# Patient Record
Sex: Female | Born: 1985 | Race: White | Hispanic: No | Marital: Married | Smoking: Never smoker
Health system: Southern US, Community
[De-identification: ages and names within clinical notes are randomized; demographics above are authoritative.]

## PROBLEM LIST (undated history)

## (undated) DIAGNOSIS — Z8719 Personal history of other diseases of the digestive system: Secondary | ICD-10-CM

## (undated) DIAGNOSIS — N809 Endometriosis, unspecified: Secondary | ICD-10-CM

## (undated) DIAGNOSIS — N200 Calculus of kidney: Secondary | ICD-10-CM

## (undated) DIAGNOSIS — Z87898 Personal history of other specified conditions: Secondary | ICD-10-CM

## (undated) DIAGNOSIS — N3010 Interstitial cystitis (chronic) without hematuria: Secondary | ICD-10-CM

## (undated) DIAGNOSIS — F419 Anxiety disorder, unspecified: Secondary | ICD-10-CM

## (undated) DIAGNOSIS — Z8742 Personal history of other diseases of the female genital tract: Secondary | ICD-10-CM

## (undated) DIAGNOSIS — M419 Scoliosis, unspecified: Secondary | ICD-10-CM

## (undated) DIAGNOSIS — R51 Headache: Secondary | ICD-10-CM

## (undated) DIAGNOSIS — K5909 Other constipation: Secondary | ICD-10-CM

## (undated) DIAGNOSIS — K589 Irritable bowel syndrome without diarrhea: Secondary | ICD-10-CM

## (undated) DIAGNOSIS — Z8632 Personal history of gestational diabetes: Secondary | ICD-10-CM

## (undated) DIAGNOSIS — N390 Urinary tract infection, site not specified: Secondary | ICD-10-CM

## (undated) DIAGNOSIS — I7300 Raynaud's syndrome without gangrene: Secondary | ICD-10-CM

## (undated) DIAGNOSIS — R519 Headache, unspecified: Secondary | ICD-10-CM

## (undated) HISTORY — DX: Headache, unspecified: R51.9

## (undated) HISTORY — DX: Anxiety disorder, unspecified: F41.9

## (undated) HISTORY — DX: Personal history of other diseases of the female genital tract: Z87.42

## (undated) HISTORY — DX: Scoliosis, unspecified: M41.9

## (undated) HISTORY — DX: Calculus of kidney: N20.0

## (undated) HISTORY — PX: WISDOM TOOTH EXTRACTION: SHX21

## (undated) HISTORY — DX: Headache: R51

## (undated) HISTORY — DX: Endometriosis, unspecified: N80.9

## (undated) HISTORY — DX: Personal history of other diseases of the digestive system: Z87.19

## (undated) HISTORY — DX: Raynaud's syndrome without gangrene: I73.00

## (undated) HISTORY — DX: Urinary tract infection, site not specified: N39.0

## (undated) HISTORY — DX: Irritable bowel syndrome, unspecified: K58.9

## (undated) HISTORY — PX: EYE SURGERY: SHX253

## (undated) HISTORY — DX: Interstitial cystitis (chronic) without hematuria: N30.10

## (undated) HISTORY — DX: Other constipation: K59.09

## (undated) HISTORY — DX: Personal history of gestational diabetes: Z86.32

## (undated) HISTORY — DX: Personal history of other specified conditions: Z87.898

---
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
## 2003-05-08 ENCOUNTER — Other Ambulatory Visit: Admission: RE | Admit: 2003-05-08 | Discharge: 2003-05-08 | Payer: Self-pay | Admitting: Obstetrics and Gynecology

## 2004-08-27 ENCOUNTER — Other Ambulatory Visit: Admission: RE | Admit: 2004-08-27 | Discharge: 2004-08-27 | Payer: Self-pay | Admitting: Obstetrics and Gynecology

## 2008-09-18 ENCOUNTER — Other Ambulatory Visit: Admission: RE | Admit: 2008-09-18 | Discharge: 2008-09-18 | Payer: Self-pay | Admitting: Family Medicine

## 2009-01-10 ENCOUNTER — Ambulatory Visit (HOSPITAL_COMMUNITY): Admission: RE | Admit: 2009-01-10 | Discharge: 2009-01-10 | Payer: Self-pay | Admitting: Obstetrics and Gynecology

## 2009-11-10 ENCOUNTER — Inpatient Hospital Stay (HOSPITAL_COMMUNITY): Admission: AD | Admit: 2009-11-10 | Discharge: 2009-11-10 | Payer: Self-pay | Admitting: Obstetrics and Gynecology

## 2009-11-11 ENCOUNTER — Inpatient Hospital Stay (HOSPITAL_COMMUNITY): Admission: AD | Admit: 2009-11-11 | Discharge: 2009-11-13 | Payer: Self-pay | Admitting: Obstetrics & Gynecology

## 2010-08-24 LAB — CBC
HCT: 22.2 % — ABNORMAL LOW (ref 36.0–46.0)
HCT: 30.9 % — ABNORMAL LOW (ref 36.0–46.0)
Hemoglobin: 7.3 g/dL — ABNORMAL LOW (ref 12.0–15.0)
Hemoglobin: 8 g/dL — ABNORMAL LOW (ref 12.0–15.0)
MCHC: 36 g/dL (ref 30.0–36.0)
MCV: 89.5 fL (ref 78.0–100.0)
MCV: 89.5 fL (ref 78.0–100.0)
RBC: 2.28 MIL/uL — ABNORMAL LOW (ref 3.87–5.11)
RBC: 2.45 MIL/uL — ABNORMAL LOW (ref 3.87–5.11)
RDW: 13.3 % (ref 11.5–15.5)
WBC: 15.3 10*3/uL — ABNORMAL HIGH (ref 4.0–10.5)

## 2010-08-24 LAB — COMPREHENSIVE METABOLIC PANEL
AST: 27 U/L (ref 0–37)
Alkaline Phosphatase: 76 U/L (ref 39–117)
Calcium: 8.4 mg/dL (ref 8.4–10.5)

## 2010-08-24 LAB — RH IMMUNE GLOB WKUP(>/=20WKS)(NOT WOMEN'S HOSP): Fetal Screen: NEGATIVE

## 2010-08-24 LAB — LACTATE DEHYDROGENASE: LDH: 214 U/L (ref 94–250)

## 2010-08-24 LAB — RPR: RPR Ser Ql: NONREACTIVE

## 2010-09-13 LAB — ABO/RH: ABO/RH(D): O NEG

## 2011-03-08 ENCOUNTER — Ambulatory Visit (HOSPITAL_BASED_OUTPATIENT_CLINIC_OR_DEPARTMENT_OTHER)
Admission: RE | Admit: 2011-03-08 | Discharge: 2011-03-08 | Disposition: A | Discharge status: Home / Self Care | Payer: 59 | Source: Ambulatory Visit | Attending: Family Medicine | Admitting: Family Medicine

## 2011-03-08 ENCOUNTER — Other Ambulatory Visit (HOSPITAL_BASED_OUTPATIENT_CLINIC_OR_DEPARTMENT_OTHER): Payer: Self-pay | Admitting: Family Medicine

## 2011-03-08 DIAGNOSIS — R109 Unspecified abdominal pain: Secondary | ICD-10-CM

## 2011-03-08 DIAGNOSIS — R1031 Right lower quadrant pain: Secondary | ICD-10-CM

## 2011-03-08 DIAGNOSIS — N201 Calculus of ureter: Secondary | ICD-10-CM | POA: Insufficient documentation

## 2011-03-08 DIAGNOSIS — R319 Hematuria, unspecified: Secondary | ICD-10-CM | POA: Insufficient documentation

## 2011-03-08 DIAGNOSIS — N200 Calculus of kidney: Secondary | ICD-10-CM

## 2012-01-20 ENCOUNTER — Emergency Department (HOSPITAL_COMMUNITY): Payer: 59

## 2012-01-20 ENCOUNTER — Emergency Department (HOSPITAL_COMMUNITY)
Admission: EM | Admit: 2012-01-20 | Discharge: 2012-01-20 | Disposition: A | Discharge status: Home / Self Care | Payer: 59 | Attending: Emergency Medicine | Admitting: Emergency Medicine

## 2012-01-20 ENCOUNTER — Encounter (HOSPITAL_COMMUNITY): Payer: Self-pay | Admitting: Emergency Medicine

## 2012-01-20 DIAGNOSIS — N83209 Unspecified ovarian cyst, unspecified side: Secondary | ICD-10-CM

## 2012-01-20 DIAGNOSIS — R10819 Abdominal tenderness, unspecified site: Secondary | ICD-10-CM | POA: Insufficient documentation

## 2012-01-20 DIAGNOSIS — R109 Unspecified abdominal pain: Secondary | ICD-10-CM | POA: Insufficient documentation

## 2012-01-20 DIAGNOSIS — R112 Nausea with vomiting, unspecified: Secondary | ICD-10-CM | POA: Insufficient documentation

## 2012-01-20 LAB — CBC
HCT: 39.1 % (ref 36.0–46.0)
Hemoglobin: 13.7 g/dL (ref 12.0–15.0)
MCH: 31.1 pg (ref 26.0–34.0)
MCHC: 35 g/dL (ref 30.0–36.0)
MCV: 88.9 fL (ref 78.0–100.0)
Platelets: 199 10*3/uL (ref 150–400)
RBC: 4.4 MIL/uL (ref 3.87–5.11)
RDW: 12.6 % (ref 11.5–15.5)
WBC: 10.1 10*3/uL (ref 4.0–10.5)

## 2012-01-20 LAB — URINE MICROSCOPIC-ADD ON

## 2012-01-20 LAB — URINALYSIS, ROUTINE W REFLEX MICROSCOPIC
Bilirubin Urine: NEGATIVE
Glucose, UA: NEGATIVE mg/dL
Nitrite: NEGATIVE
Protein, ur: NEGATIVE mg/dL
Specific Gravity, Urine: 1.013 (ref 1.005–1.030)

## 2012-01-20 LAB — POCT I-STAT, CHEM 8
Creatinine, Ser: 0.9 mg/dL (ref 0.50–1.10)
Glucose, Bld: 96 mg/dL (ref 70–99)
Potassium: 4.3 mEq/L (ref 3.5–5.1)

## 2012-01-20 LAB — HEPATIC FUNCTION PANEL
ALT: 39 U/L — ABNORMAL HIGH (ref 0–35)
AST: 34 U/L (ref 0–37)
Albumin: 3.9 g/dL (ref 3.5–5.2)
Bilirubin, Direct: <0.1 mg/dL (ref 0.0–0.3)
Total Protein: 7.2 g/dL (ref 6.0–8.3)

## 2012-01-20 LAB — WET PREP, GENITAL: Trich, Wet Prep: NONE SEEN

## 2012-01-20 MED ORDER — ONDANSETRON HCL 4 MG PO TABS: 4.0000 mg | ORAL_TABLET | Freq: Four times a day (QID) | ORAL | Status: AC

## 2012-01-20 MED ORDER — ONDANSETRON 4 MG PO TBDP
8.0000 mg | ORAL_TABLET | Freq: Once | ORAL | Status: AC
  Administered 2012-01-20: 8 mg via ORAL
  Filled 2012-01-20: qty 2

## 2012-01-20 MED ORDER — HYDROCODONE-ACETAMINOPHEN 5-500 MG PO TABS: 1.0000 | ORAL_TABLET | Freq: Four times a day (QID) | ORAL | Status: AC | PRN

## 2012-01-20 NOTE — ED Notes (Signed)
Patient transported to Ultrasound 

## 2012-01-20 NOTE — ED Notes (Signed)
Pt  Here with c/o lower abd pain . Pain started this morning no bleeding or discharge

## 2012-01-20 NOTE — ED Notes (Signed)
Pt is 3 weeks late on her period

## 2012-01-20 NOTE — ED Provider Notes (Signed)
History     CSN: [id number]  Arrival date & time 01/20/12  0930   First MD Initiated Contact with Patient 01/20/12 1006      Chief Complaint  Patient presents with  . Abdominal Pain    (Consider location/radiation/quality/duration/timing/severity/associated sxs/prior treatment) Patient is a 26 y.o. female presenting with abdominal pain. The history is provided by the patient.  Abdominal Pain The primary symptoms of the illness include abdominal pain, nausea and vomiting. The primary symptoms of the illness do not include fever, dysuria, vaginal discharge or vaginal bleeding. Primary symptoms comment: one loose stool this am The current episode started 1 to 2 hours ago. The onset of the illness was sudden. The problem has not changed since onset. The pain came on suddenly. The abdominal pain has been unchanged since its onset. Pain Location: right pelvic and suprapubic region. The abdominal pain radiates to the right flank. The severity of the abdominal pain is 10/10. The abdominal pain is relieved by nothing. The abdominal pain is exacerbated by movement.  The vomiting began today. Vomiting occurs 2 to 5 times per day. The emesis contains stomach contents (dry heaving).  Additional symptoms associated with the illness include anorexia. Symptoms associated with the illness do not include constipation, urgency, frequency or back pain.    History reviewed. No pertinent past medical history.  History reviewed. No pertinent past surgical history.  History reviewed. No pertinent family history.  History  Substance Use Topics  . Smoking status: Never Smoker   . Smokeless tobacco: Not on file  . Alcohol Use: No    OB History    Grav Para Term Preterm Abortions TAB SAB Ect Mult Living                  Review of Systems  Constitutional: Negative for fever.  Gastrointestinal: Positive for nausea, vomiting, abdominal pain and anorexia. Negative for constipation.  Genitourinary:  Negative for dysuria, urgency, frequency, vaginal bleeding and vaginal discharge.  Musculoskeletal: Negative for back pain.  All other systems reviewed and are negative.    Allergies  Review of patient's allergies indicates no known allergies.  Home Medications  No current outpatient prescriptions on file.  BP 101/68  Pulse 70  Temp 97.6 F (36.4 C) (Oral)  Resp 18  SpO2 100%  Physical Exam  Nursing note and vitals reviewed. Constitutional: She is oriented to person, place, and time. She appears well-developed and well-nourished. No distress.  HENT:  Head: Normocephalic and atraumatic.  Mouth/Throat: Oropharynx is clear and moist.  Eyes: Conjunctivae and EOM are normal. Pupils are equal, round, and reactive to light.  Neck: Normal range of motion. Neck supple.  Cardiovascular: Normal rate, regular rhythm and intact distal pulses.   No murmur heard. Pulmonary/Chest: Effort normal and breath sounds normal. No respiratory distress. She has no wheezes. She has no rales.  Abdominal: Soft. Normal appearance. She exhibits no distension. There is tenderness in the suprapubic area. There is no rebound and no guarding.    Genitourinary: Vagina normal and uterus normal. Cervix exhibits no discharge. Right adnexum displays tenderness. Right adnexum displays no mass and no fullness. Left adnexum displays no mass, no tenderness and no fullness.  Musculoskeletal: Normal range of motion. She exhibits no edema and no tenderness.  Neurological: She is alert and oriented to person, place, and time.  Skin: Skin is warm and dry. No rash noted. No erythema.  Psychiatric: She has a normal mood and affect. Her behavior is normal.  ED Course  Procedures (including critical care time)  Labs Reviewed  URINALYSIS, ROUTINE W REFLEX MICROSCOPIC - Abnormal; Notable for the following:    Leukocytes, UA TRACE (*)     All other components within normal limits  HEPATIC FUNCTION PANEL - Abnormal;  Notable for the following:    ALT 39 (*)     All other components within normal limits  WET PREP, GENITAL - Abnormal; Notable for the following:    Clue Cells Wet Prep HPF POC FEW (*)     WBC, Wet Prep HPF POC MODERATE (*)     All other components within normal limits  CBC  POCT I-STAT, CHEM 8  URINE MICROSCOPIC-ADD ON  POCT PREGNANCY, URINE  GC/CHLAMYDIA PROBE AMP, GENITAL   US Transvaginal Non-ob  01/20/2012  *RADIOLOGY REPORT*  Clinical Data:  Evaluate for right ovarian cyst versus torsion.  TRANSABDOMINAL AND TRANSVAGINAL ULTRASOUND OF PELVIS DOPPLER ULTRASOUND OF OVARIES  Technique:  Both transabdominal and transvaginal ultrasound examinations of the pelvis were performed. Transabdominal technique was performed for global imaging of the pelvis including uterus, ovaries, adnexal regions, and pelvic cul-de-sac.  It was necessary to proceed with endovaginal exam following the transabdominal exam to visualize the ovaries.  Color and duplex Doppler ultrasound was utilized to evaluate blood flow to the ovaries.  Comparison:  CT abdomen pelvis 03/08/2011  Findings:  Uterus:  Normal in size and appearance.  Endometrium:  Measures 7 mm maximal thickness.  Right ovary: Measures 4.0 x 2.9 x 3.8 cm and contains a dominant follicle versus corpus luteum that measures 2.5 cm maximal diameter.  Color Doppler flow is identified to the right ovary.  Venous and arterial waveforms are documented within the right ovarian parenchyma on Doppler imaging.  Left ovary:   The left ovary is positioned in the cul-de-sac and contains a complex cyst measuring 5.8 x 3.8 x 4.4 cm.  There are a few thin internal septations as well as very lacy septations, likely reflecting retracting clot within a hemorrhagic cyst. Color Doppler flow is identified to the surrounding left ovarian parenchyma.  No flow is seen within the complex cyst.  Doppler imaging demonstrates venous and arterial waveforms within the ovarian parenchyma.  There is  a moderate amount of free fluid in the pelvis, predominately seen adjacent to the left ovary, within the cul-de- sac and left adnexa.  IMPRESSION: 1.  5.8 x 3.8 x 4.4 cm mildly complex cyst in the left ovary has appearances most suggestive of a hemorrhagic cyst.  Moderate amount of free fluid in the pelvis. The possibility of cyst rupture is considered.  Short-interval follow up ultrasound in 6-12 weeks is recommended, preferably during the week following the patient's normal menses. 2.  No sonographic evidence of ovarian torsion. 3.  Uterus and right ovary within normal limits.  Original Report Authenticated By: Britta Mccreedy, M.D.   US Pelvis Complete  01/20/2012  *RADIOLOGY REPORT*  Clinical Data:  Evaluate for right ovarian cyst versus torsion.  TRANSABDOMINAL AND TRANSVAGINAL ULTRASOUND OF PELVIS DOPPLER ULTRASOUND OF OVARIES  Technique:  Both transabdominal and transvaginal ultrasound examinations of the pelvis were performed. Transabdominal technique was performed for global imaging of the pelvis including uterus, ovaries, adnexal regions, and pelvic cul-de-sac.  It was necessary to proceed with endovaginal exam following the transabdominal exam to visualize the ovaries.  Color and duplex Doppler ultrasound was utilized to evaluate blood flow to the ovaries.  Comparison:  CT abdomen pelvis 03/08/2011  Findings:  Uterus:  Normal in  size and appearance.  Endometrium:  Measures 7 mm maximal thickness.  Right ovary: Measures 4.0 x 2.9 x 3.8 cm and contains a dominant follicle versus corpus luteum that measures 2.5 cm maximal diameter.  Color Doppler flow is identified to the right ovary.  Venous and arterial waveforms are documented within the right ovarian parenchyma on Doppler imaging.  Left ovary:   The left ovary is positioned in the cul-de-sac and contains a complex cyst measuring 5.8 x 3.8 x 4.4 cm.  There are a few thin internal septations as well as very lacy septations, likely reflecting retracting  clot within a hemorrhagic cyst. Color Doppler flow is identified to the surrounding left ovarian parenchyma.  No flow is seen within the complex cyst.  Doppler imaging demonstrates venous and arterial waveforms within the ovarian parenchyma.  There is a moderate amount of free fluid in the pelvis, predominately seen adjacent to the left ovary, within the cul-de- sac and left adnexa.  IMPRESSION: 1.  5.8 x 3.8 x 4.4 cm mildly complex cyst in the left ovary has appearances most suggestive of a hemorrhagic cyst.  Moderate amount of free fluid in the pelvis. The possibility of cyst rupture is considered.  Short-interval follow up ultrasound in 6-12 weeks is recommended, preferably during the week following the patient's normal menses. 2.  No sonographic evidence of ovarian torsion. 3.  Uterus and right ovary within normal limits.  Original Report Authenticated By: Britta Mccreedy, M.D.   [place] Art/ven Flow Abd Pelv Doppler  01/20/2012  *RADIOLOGY REPORT*  Clinical Data:  Evaluate for right ovarian cyst versus torsion.  TRANSABDOMINAL AND TRANSVAGINAL ULTRASOUND OF PELVIS DOPPLER ULTRASOUND OF OVARIES  Technique:  Both transabdominal and transvaginal ultrasound examinations of the pelvis were performed. Transabdominal technique was performed for global imaging of the pelvis including uterus, ovaries, adnexal regions, and pelvic cul-de-sac.  It was necessary to proceed with endovaginal exam following the transabdominal exam to visualize the ovaries.  Color and duplex Doppler ultrasound was utilized to evaluate blood flow to the ovaries.  Comparison:  CT abdomen pelvis 03/08/2011  Findings:  Uterus:  Normal in size and appearance.  Endometrium:  Measures 7 mm maximal thickness.  Right ovary: Measures 4.0 x 2.9 x 3.8 cm and contains a dominant follicle versus corpus luteum that measures 2.5 cm maximal diameter.  Color Doppler flow is identified to the right ovary.  Venous and arterial waveforms are documented within the right  ovarian parenchyma on Doppler imaging.  Left ovary:   The left ovary is positioned in the cul-de-sac and contains a complex cyst measuring 5.8 x 3.8 x 4.4 cm.  There are a few thin internal septations as well as very lacy septations, likely reflecting retracting clot within a hemorrhagic cyst. Color Doppler flow is identified to the surrounding left ovarian parenchyma.  No flow is seen within the complex cyst.  Doppler imaging demonstrates venous and arterial waveforms within the ovarian parenchyma.  There is a moderate amount of free fluid in the pelvis, predominately seen adjacent to the left ovary, within the cul-de- sac and left adnexa.  IMPRESSION: 1.  5.8 x 3.8 x 4.4 cm mildly complex cyst in the left ovary has appearances most suggestive of a hemorrhagic cyst.  Moderate amount of free fluid in the pelvis. The possibility of cyst rupture is considered.  Short-interval follow up ultrasound in 6-12 weeks is recommended, preferably during the week following the patient's normal menses. 2.  No sonographic evidence of ovarian torsion. 3.  Uterus and right ovary within normal limits.  Original Report Authenticated By: Britta Mccreedy, M.D.     1. Ovarian cyst       MDM   Patient with abrupt onset of a lower right-sided abdominal pain today that began 2 hours ago. The pain was so severe that it caused her to dry heave. She states in the past she's had ovarian cysts and a possible muscle strain in the right lower quadrant but states it's never been this bad. She does not take medications regularly and has no allergies. On exam she is exquisitely tender in the right pelvic region. No symptoms suggestive of appendicitis at this time.  Also states she is 3 weeks late on her period however her husband has had a vasectomy.  Also possibility for kidney stone however symptoms are not classic. Lower concern for ectopic due to husband having a vasectomy.  CBC, i-STAT, LFTs, UA, UPT, ultrasound pending.  1:30  PM Labs unrevealing except for ultrasound showing free pelvic fluid consistent with a ruptured ovarian cyst as well as a complex cyst on the left ovary. Patient will followup with her OB/GYN for repeat ultrasound and followup      Gwyneth Sprout, MD 01/20/12 1330

## 2012-01-20 NOTE — ED Notes (Signed)
Patient in ultrasound.

## 2012-02-22 ENCOUNTER — Ambulatory Visit (HOSPITAL_BASED_OUTPATIENT_CLINIC_OR_DEPARTMENT_OTHER): Admission: RE | Admit: 2012-02-22 | Payer: 59 | Source: Ambulatory Visit | Admitting: Obstetrics and Gynecology

## 2012-02-22 ENCOUNTER — Encounter (HOSPITAL_BASED_OUTPATIENT_CLINIC_OR_DEPARTMENT_OTHER): Admission: RE | Payer: Self-pay | Source: Ambulatory Visit

## 2012-02-22 SURGERY — LAPAROSCOPY, DIAGNOSTIC
Anesthesia: General

## 2012-04-20 HISTORY — PX: DOPPLER ECHOCARDIOGRAPHY: SHX263

## 2012-08-29 NOTE — H&P (Signed)
  Patient name  Natalie Brennan, [place] DICTATION#  [id number] CSN# [id number]  [facility], MD 08/29/2012 9:44 AM

## 2012-08-30 NOTE — H&P (Signed)
NAMEMEDRITH, VEILLON              ACCOUNT NO.:  [id number]  MEDICAL RECORD NO.:  [id number]  LOCATION:                                 FACILITY:  PHYSICIAN:  Juluis Mire, M.D.   DATE OF BIRTH:  05/23/1986  DATE OF ADMISSION: DATE OF DISCHARGE:                             HISTORY & PHYSICAL   DATE OF SURGERY:  September 04, 2012, at [facility].  The patient is a 27 year old, gravida 3, para 2, female comes in for diagnostic laparoscopy with laser standby.  The patient has been having trouble with increasing dysmenorrhea and dyspareunia associated with her cycles.  She also reports 2 days of relatively heavy flow.  She has had a history of persistent right lower quadrant pain.  Previous workup including a CT scan that showed a left adnexal complex cyst.  Subsequent followup ultrasound here in the office revealed that the cyst had basically resolved endometrial thickness was 8.2 mm.  She presents now to undergo hysteroscopy also for evaluation of menorrhagia and possible endometrial polyp.  ALLERGIES:  In terms of allergies, no known drug allergies.  MEDICATIONS:  None.  PAST MEDICAL HISTORY:  Usual childhood diseases.  No significant sequelae.  SURGICAL HISTORY:  She has had 2 vaginal deliveries and miscarriage.  SOCIAL HISTORY:  Reveals no tobacco alcohol use.  FAMILY HISTORY:  Noncontributory.  REVIEW OF SYSTEMS:  Noncontributory.  PHYSICAL EXAMINATION:  VITAL SIGNS:  The patient is afebrile.  Stable vital signs. HEENT:  The patient is normocephalic.  Pupils are equal, round, and reactive to light and accommodation.  Extraocular movements were intact. Sclerae and conjunctivae are clear.  Oropharynx clear. NECK:  Without thyromegaly. BREASTS:  Not examined. LUNGS:  Clear. CARDIOVASCULAR SYSTEM:  Regular rate.  There are no murmurs or gallops. ABDOMEN:  Benign.  No mass, organomegaly, or tenderness. PELVIC:  Normal external genitalia.  Vaginal mucosa is clear.   Cervix unremarkable.  Uterus is upper limits, normal size, mildly tender. Adnexa unremarkable. EXTREMITIES:  Trace edema. NEUROLOGIC:  Grossly within normal limits.  IMPRESSION: 1. Dysmenorrhea and dyspareunia, possible endometriosis. 2. Menorrhagia.  PLAN:  The patient will undergo diagnostic laparoscopy with laser standby as well as hysteroscopy.  The risks of surgery have been discussed including the risk of infection.  Risk of hemorrhage that could require transfusion with the risk of AIDS or hepatitis.  Excessive bleeding could require hysterectomy.  Risk of injury to adjacent organs including bladder, bowel, ureters, could require further exploratory surgery.  Risk of deep venous thrombosis and pulmonary embolus.  Patient does understand the indications, risks, and alternatives.     Juluis Mire, M.D.     JSM/MEDQ  D:  08/29/2012  T:  08/29/2012  Job:  [id number]

## 2012-08-31 ENCOUNTER — Encounter (HOSPITAL_COMMUNITY): Payer: Self-pay | Admitting: Pharmacist

## 2012-09-01 ENCOUNTER — Encounter (HOSPITAL_COMMUNITY): Payer: Self-pay

## 2012-09-01 ENCOUNTER — Encounter (HOSPITAL_COMMUNITY)
Admission: RE | Admit: 2012-09-01 | Discharge: 2012-09-01 | Disposition: A | Discharge status: Home / Self Care | Payer: BC Managed Care – PPO | Source: Ambulatory Visit | Attending: Obstetrics and Gynecology | Admitting: Obstetrics and Gynecology

## 2012-09-01 LAB — CBC
HCT: 35.8 % — ABNORMAL LOW (ref 36.0–46.0)
Hemoglobin: 12.7 g/dL (ref 12.0–15.0)
MCH: 31 pg (ref 26.0–34.0)
MCV: 87.3 fL (ref 78.0–100.0)
RBC: 4.1 MIL/uL (ref 3.87–5.11)

## 2012-09-01 LAB — SURGICAL PCR SCREEN: MRSA, PCR: NEGATIVE

## 2012-09-01 NOTE — Patient Instructions (Addendum)
   Your procedure is scheduled on: Monday, March 31   Enter through the Main Entrance of [facility] at: 6 am Pick up the phone at the desk and dial [phone] and inform us of your arrival.  Please call this number if you have any problems the morning of surgery: [phone]  Remember: Do not eat or drink after midnight: Sunday Take these medicines the morning of surgery with a SIP OF WATER: None  Do not wear jewelry, make-up, or FINGER nail polish No metal in your hair or on your body. Do not wear lotions, powders, perfumes. You may wear deodorant.  Please use your CHG wash as directed prior to surgery.  Do not shave anywhere for at least 12 hours prior to first CHG shower.  Do not bring valuables to the hospital. Contacts, dentures or bridgework may not be worn into surgery.  Patients discharged on the day of surgery will not be allowed to drive home.    Home with husband Caryn Bee

## 2012-09-04 ENCOUNTER — Encounter (HOSPITAL_COMMUNITY): Payer: Self-pay | Admitting: Anesthesiology

## 2012-09-04 ENCOUNTER — Ambulatory Visit (HOSPITAL_COMMUNITY): Payer: BC Managed Care – PPO | Admitting: Anesthesiology

## 2012-09-04 ENCOUNTER — Encounter (HOSPITAL_COMMUNITY): Payer: Self-pay

## 2012-09-04 ENCOUNTER — Ambulatory Visit (HOSPITAL_COMMUNITY)
Admission: RE | Admit: 2012-09-04 | Discharge: 2012-09-04 | Disposition: A | Discharge status: Home / Self Care | Payer: BC Managed Care – PPO | Source: Ambulatory Visit | Attending: Obstetrics and Gynecology | Admitting: Obstetrics and Gynecology

## 2012-09-04 ENCOUNTER — Encounter (HOSPITAL_COMMUNITY)
Admission: RE | Disposition: A | Discharge status: Home / Self Care | Payer: Self-pay | Source: Ambulatory Visit | Attending: Obstetrics and Gynecology

## 2012-09-04 DIAGNOSIS — N809 Endometriosis, unspecified: Secondary | ICD-10-CM

## 2012-09-04 DIAGNOSIS — N946 Dysmenorrhea, unspecified: Secondary | ICD-10-CM | POA: Insufficient documentation

## 2012-09-04 DIAGNOSIS — N803 Endometriosis of pelvic peritoneum, unspecified: Secondary | ICD-10-CM | POA: Insufficient documentation

## 2012-09-04 DIAGNOSIS — N920 Excessive and frequent menstruation with regular cycle: Secondary | ICD-10-CM | POA: Insufficient documentation

## 2012-09-04 DIAGNOSIS — IMO0002 Reserved for concepts with insufficient information to code with codable children: Secondary | ICD-10-CM | POA: Insufficient documentation

## 2012-09-04 DIAGNOSIS — R1031 Right lower quadrant pain: Secondary | ICD-10-CM | POA: Insufficient documentation

## 2012-09-04 HISTORY — PX: DILATATION & CURRETTAGE/HYSTEROSCOPY WITH RESECTOCOPE: SHX5572

## 2012-09-04 HISTORY — PX: LAPAROSCOPY: SHX197

## 2012-09-04 LAB — HCG, SERUM, QUALITATIVE: Preg, Serum: NEGATIVE

## 2012-09-04 SURGERY — DILATATION & CURETTAGE/HYSTEROSCOPY WITH RESECTOCOPE
Anesthesia: General | Site: Vagina | Wound class: Clean Contaminated

## 2012-09-04 MED ORDER — ONDANSETRON HCL 4 MG/2ML IJ SOLN
INTRAMUSCULAR | Status: AC
  Filled 2012-09-04: qty 2

## 2012-09-04 MED ORDER — CEFAZOLIN SODIUM-DEXTROSE 2-3 GM-% IV SOLR
2.0000 g | INTRAVENOUS | Status: AC
  Administered 2012-09-04: 2 g via INTRAVENOUS

## 2012-09-04 MED ORDER — KETOROLAC TROMETHAMINE 30 MG/ML IJ SOLN
INTRAMUSCULAR | Status: DC | PRN
  Administered 2012-09-04: 30 mg via INTRAVENOUS

## 2012-09-04 MED ORDER — GLYCOPYRROLATE 0.2 MG/ML IJ SOLN
INTRAMUSCULAR | Status: AC
  Filled 2012-09-04: qty 3

## 2012-09-04 MED ORDER — KETOROLAC TROMETHAMINE 30 MG/ML IJ SOLN
INTRAMUSCULAR | Status: AC
  Filled 2012-09-04: qty 1

## 2012-09-04 MED ORDER — ONDANSETRON HCL 4 MG/2ML IJ SOLN: 4.0000 mg | Freq: Once | INTRAMUSCULAR | Status: AC

## 2012-09-04 MED ORDER — GLYCINE 1.5 % IR SOLN
Status: DC | PRN
  Administered 2012-09-04: 3000 mL

## 2012-09-04 MED ORDER — FENTANYL CITRATE 0.05 MG/ML IJ SOLN: 25.0000 ug | INTRAMUSCULAR | Status: DC | PRN

## 2012-09-04 MED ORDER — ONDANSETRON HCL 4 MG/2ML IJ SOLN
INTRAMUSCULAR | Status: DC | PRN
  Administered 2012-09-04: 4 mg via INTRAVENOUS

## 2012-09-04 MED ORDER — GLYCOPYRROLATE 0.2 MG/ML IJ SOLN
INTRAMUSCULAR | Status: DC | PRN
  Administered 2012-09-04: 0.6 mg via INTRAVENOUS

## 2012-09-04 MED ORDER — NEOSTIGMINE METHYLSULFATE 1 MG/ML IJ SOLN
INTRAMUSCULAR | Status: AC
  Filled 2012-09-04: qty 1

## 2012-09-04 MED ORDER — METOCLOPRAMIDE HCL 5 MG/ML IJ SOLN
INTRAMUSCULAR | Status: DC | PRN
  Administered 2012-09-04: 10 mg via INTRAVENOUS

## 2012-09-04 MED ORDER — LIDOCAINE HCL (CARDIAC) 20 MG/ML IV SOLN
INTRAVENOUS | Status: AC
  Filled 2012-09-04: qty 5

## 2012-09-04 MED ORDER — DEXAMETHASONE SODIUM PHOSPHATE 10 MG/ML IJ SOLN
INTRAMUSCULAR | Status: DC | PRN
  Administered 2012-09-04: 10 mg via INTRAVENOUS

## 2012-09-04 MED ORDER — LACTATED RINGERS IV SOLN
INTRAVENOUS | Status: DC
  Administered 2012-09-04: 08:00:00 via INTRAVENOUS
  Administered 2012-09-04: 100 mL/h via INTRAVENOUS
  Administered 2012-09-04: 09:00:00 via INTRAVENOUS

## 2012-09-04 MED ORDER — CEFAZOLIN SODIUM-DEXTROSE 2-3 GM-% IV SOLR
INTRAVENOUS | Status: AC
  Filled 2012-09-04: qty 50

## 2012-09-04 MED ORDER — LIDOCAINE HCL (CARDIAC) 20 MG/ML IV SOLN
INTRAVENOUS | Status: DC | PRN
  Administered 2012-09-04: 50 mg via INTRAVENOUS

## 2012-09-04 MED ORDER — ROCURONIUM BROMIDE 100 MG/10ML IV SOLN
INTRAVENOUS | Status: DC | PRN
  Administered 2012-09-04: 30 mg via INTRAVENOUS
  Administered 2012-09-04: 10 mg via INTRAVENOUS

## 2012-09-04 MED ORDER — MIDAZOLAM HCL 5 MG/5ML IJ SOLN
INTRAMUSCULAR | Status: DC | PRN
  Administered 2012-09-04: 2 mg via INTRAVENOUS

## 2012-09-04 MED ORDER — METOCLOPRAMIDE HCL 5 MG/ML IJ SOLN
INTRAMUSCULAR | Status: AC
  Filled 2012-09-04: qty 2

## 2012-09-04 MED ORDER — ONDANSETRON HCL 4 MG/2ML IJ SOLN
INTRAMUSCULAR | Status: AC
  Administered 2012-09-04: 4 mg via INTRAVENOUS
  Filled 2012-09-04: qty 2

## 2012-09-04 MED ORDER — PROPOFOL 10 MG/ML IV EMUL
INTRAVENOUS | Status: AC
  Filled 2012-09-04: qty 20

## 2012-09-04 MED ORDER — BUPIVACAINE HCL (PF) 0.25 % IJ SOLN
INTRAMUSCULAR | Status: DC | PRN
  Administered 2012-09-04: 4 mL

## 2012-09-04 MED ORDER — BUPIVACAINE HCL (PF) 0.25 % IJ SOLN
INTRAMUSCULAR | Status: AC
  Filled 2012-09-04: qty 30

## 2012-09-04 MED ORDER — LIDOCAINE-EPINEPHRINE (PF) 1 %-1:200000 IJ SOLN
INTRAMUSCULAR | Status: AC
  Filled 2012-09-04: qty 10

## 2012-09-04 MED ORDER — MIDAZOLAM HCL 2 MG/2ML IJ SOLN
INTRAMUSCULAR | Status: AC
  Filled 2012-09-04: qty 2

## 2012-09-04 MED ORDER — SCOPOLAMINE 1 MG/3DAYS TD PT72
MEDICATED_PATCH | TRANSDERMAL | Status: AC
  Administered 2012-09-04: 1 via TRANSDERMAL
  Filled 2012-09-04: qty 1

## 2012-09-04 MED ORDER — PROMETHAZINE HCL 25 MG RE SUPP: 25.0000 mg | RECTAL | Status: DC | PRN

## 2012-09-04 MED ORDER — DEXAMETHASONE SODIUM PHOSPHATE 10 MG/ML IJ SOLN
INTRAMUSCULAR | Status: AC
  Filled 2012-09-04: qty 1

## 2012-09-04 MED ORDER — PROPOFOL 10 MG/ML IV EMUL
INTRAVENOUS | Status: DC | PRN
  Administered 2012-09-04: 180 mg via INTRAVENOUS

## 2012-09-04 MED ORDER — FENTANYL CITRATE 0.05 MG/ML IJ SOLN
INTRAMUSCULAR | Status: AC
Start: 2012-09-04 — End: 2012-09-04
  Filled 2012-09-04: qty 5

## 2012-09-04 MED ORDER — ROCURONIUM BROMIDE 50 MG/5ML IV SOLN
INTRAVENOUS | Status: AC
  Filled 2012-09-04: qty 1

## 2012-09-04 MED ORDER — FENTANYL CITRATE 0.05 MG/ML IJ SOLN
INTRAMUSCULAR | Status: DC | PRN
  Administered 2012-09-04: 100 ug via INTRAVENOUS

## 2012-09-04 MED ORDER — LACTATED RINGERS IR SOLN
Status: DC | PRN
  Administered 2012-09-04: 3000 mL

## 2012-09-04 MED ORDER — NEOSTIGMINE METHYLSULFATE 1 MG/ML IJ SOLN
INTRAMUSCULAR | Status: DC | PRN
  Administered 2012-09-04: 4 mg via INTRAVENOUS

## 2012-09-04 SURGICAL SUPPLY — 39 items
ADH SKN CLS APL DERMABOND .7 (GAUZE/BANDAGES/DRESSINGS) ×2
BAG SPEC RTRVL LRG 6X4 10 (ENDOMECHANICALS)
CABLE HIGH FREQUENCY MONO STRZ (ELECTRODE) IMPLANT
CANISTER SUCTION 2500CC (MISCELLANEOUS) ×3 IMPLANT
CATH ROBINSON RED A/P 16FR (CATHETERS) ×3 IMPLANT
CLOTH BEACON ORANGE TIMEOUT ST (SAFETY) ×3 IMPLANT
CONTAINER PREFILL 10% NBF 60ML (FORM) ×6 IMPLANT
DERMABOND ADVANCED (GAUZE/BANDAGES/DRESSINGS) ×1
DERMABOND ADVANCED .7 DNX12 (GAUZE/BANDAGES/DRESSINGS) ×2 IMPLANT
DRESSING TELFA 8X3 (GAUZE/BANDAGES/DRESSINGS) ×3 IMPLANT
ELECT REM PT RETURN 9FT ADLT (ELECTROSURGICAL) ×3
ELECTRODE REM PT RTRN 9FT ADLT (ELECTROSURGICAL) ×2 IMPLANT
GLOVE BIO SURGEON STRL SZ7 (GLOVE) ×3 IMPLANT
GOWN PREVENTION PLUS XLARGE (GOWN DISPOSABLE) ×3 IMPLANT
GOWN STRL REIN XL XLG (GOWN DISPOSABLE) ×6 IMPLANT
LOOP ANGLED CUTTING 22FR (CUTTING LOOP) IMPLANT
NDL SPNL 20GX3.5 QUINCKE YW (NEEDLE) ×2 IMPLANT
NEEDLE INSUFFLATION 120MM (ENDOMECHANICALS) IMPLANT
NEEDLE SPNL 20GX3.5 QUINCKE YW (NEEDLE) ×3 IMPLANT
NS IRRIG 1000ML POUR BTL (IV SOLUTION) ×3 IMPLANT
PACK HYSTEROSCOPY LF (CUSTOM PROCEDURE TRAY) ×3 IMPLANT
PACK LAPAROSCOPY BASIN (CUSTOM PROCEDURE TRAY) ×3 IMPLANT
PAD OB MATERNITY 4.3X12.25 (PERSONAL CARE ITEMS) ×3 IMPLANT
POUCH SPECIMEN RETRIEVAL 10MM (ENDOMECHANICALS) IMPLANT
PROTECTOR NERVE ULNAR (MISCELLANEOUS) ×3 IMPLANT
SCISSORS LAP 5X35 DISP (ENDOMECHANICALS) IMPLANT
SCISSORS LAP 5X45 EPIX DISP (ENDOMECHANICALS) IMPLANT
SEALER TISSUE G2 CVD JAW 45CM (ENDOMECHANICALS) IMPLANT
SET IRRIG TUBING LAPAROSCOPIC (IRRIGATION / IRRIGATOR) ×1 IMPLANT
SUT VIC AB 3-0 PS2 18 (SUTURE) ×3
SUT VIC AB 3-0 PS2 18XBRD (SUTURE) ×2 IMPLANT
SUT VICRYL 0 ENDOLOOP (SUTURE) IMPLANT
SUT VICRYL 0 UR6 27IN ABS (SUTURE) IMPLANT
TOWEL OR 17X24 6PK STRL BLUE (TOWEL DISPOSABLE) ×6 IMPLANT
TROCAR BALLN 12MMX100 BLUNT (TROCAR) IMPLANT
TROCAR OPTI TIP 5M 100M (ENDOMECHANICALS) ×1 IMPLANT
TROCAR XCEL DIL TIP R 11M (ENDOMECHANICALS) ×1 IMPLANT
WARMER LAPAROSCOPE (MISCELLANEOUS) ×3 IMPLANT
WATER STERILE IRR 1000ML POUR (IV SOLUTION) ×3 IMPLANT

## 2012-09-04 NOTE — Anesthesia Postprocedure Evaluation (Signed)
Anesthesia Post Note  Patient: Natalie Brennan  Procedure(s) Performed: Procedure(s) (LRB): DILATATION & CURETTAGE/HYSTEROSCOPY WITH RESECTOCOPE (N/A) LAPAROSCOPY OPERATIVE (N/A)  Anesthesia type: General  Patient location: PACU  Post pain: Pain level controlled  Post assessment: Post-op Vital signs reviewed  Last Vitals:  Filed Vitals:   09/04/12 0618  BP: 101/70  Pulse: 75  Temp: 36.5 C  Resp: 16    Post vital signs: Reviewed  Level of consciousness: sedated  Complications: No apparent anesthesia complications

## 2012-09-04 NOTE — Op Note (Signed)
Patient name  Natalie Brennan, Natalie Brennan DICTATION#  [id number] CSN# [id number]  [facility], MD 09/04/2012 8:10 AM

## 2012-09-04 NOTE — Brief Op Note (Signed)
09/04/2012  8:09 AM  PATIENT:  Natalie Brennan  27 y.o. female  PRE-OPERATIVE DIAGNOSIS:  pelvic pain, menorrhagia  POST-OPERATIVE DIAGNOSIS:  pelvic pain, menorrhagia  PROCEDURE:  Procedure(s): DILATATION & CURETTAGE/HYSTEROSCOPY WITH RESECTOCOPE (N/A) LAPAROSCOPY OPERATIVE (N/A)  SURGEON:  Surgeon(s) and Role:    * Juluis Mire, MD - Primary  PHYSICIAN ASSISTANT:   ASSISTANTS: none   ANESTHESIA:   local and general  EBL:  Total I/O In: 500 [I.V.:500] Out: 110 [Urine:100; Blood:10]  BLOOD ADMINISTERED:none  DRAINS: none   LOCAL MEDICATIONS USED:  MARCAINE     SPECIMEN:  Source of Specimen:  endometrial curretting  DISPOSITION OF SPECIMEN:  PATHOLOGY  COUNTS:  YES  TOURNIQUET:  * No tourniquets in log *  DICTATION: .Other Dictation: Dictation Number [id number]  PLAN OF CARE: Discharge to home after PACU  PATIENT DISPOSITION:  PACU - hemodynamically stable.   Delay start of Pharmacological VTE agent (>24hrs) due to surgical blood loss or risk of bleeding: not applicable

## 2012-09-04 NOTE — H&P (Signed)
  History and physical exam unchanged 

## 2012-09-04 NOTE — Anesthesia Preprocedure Evaluation (Signed)
Anesthesia Evaluation  Patient identified by MRN, date of birth, ID band Patient awake    Reviewed: Allergy & Precautions, H&P , Patient's Chart, lab work & pertinent test results, reviewed documented beta blocker date and time   History of Anesthesia Complications (+) PONV  Airway Mallampati: II TM Distance: >3 FB Neck ROM: full    Dental no notable dental hx.    Pulmonary  breath sounds clear to auscultation  Pulmonary exam normal       Cardiovascular Rhythm:regular Rate:Normal     Neuro/Psych    GI/Hepatic   Endo/Other    Renal/GU      Musculoskeletal   Abdominal   Peds  Hematology   Anesthesia Other Findings   Reproductive/Obstetrics                           Anesthesia Physical Anesthesia Plan  ASA: I  Anesthesia Plan: General   Post-op Pain Management:    Induction: Intravenous  Airway Management Planned: Oral ETT  Additional Equipment:   Intra-op Plan:   Post-operative Plan:   Informed Consent: I have reviewed the patients History and Physical, chart, labs and discussed the procedure including the risks, benefits and alternatives for the proposed anesthesia with the patient or authorized representative who has indicated his/her understanding and acceptance.   Dental Advisory Given and Dental advisory given  Plan Discussed with: CRNA and Surgeon  Anesthesia Plan Comments: (  Discussed  general anesthesia, including possible nausea, instrumentation of airway, sore throat,pulmonary aspiration, etc. I asked if the were any outstanding questions, or  concerns before we proceeded. )        Anesthesia Quick Evaluation

## 2012-09-04 NOTE — Transfer of Care (Signed)
Immediate Anesthesia Transfer of Care Note  Patient: Natalie Brennan  Procedure(s) Performed: Procedure(s): DILATATION & CURETTAGE/HYSTEROSCOPY WITH RESECTOCOPE (N/A) LAPAROSCOPY OPERATIVE (N/A)  Patient Location: PACU  Anesthesia Type:General  Level of Consciousness: sedated  Airway & Oxygen Therapy: Patient Spontanous Breathing and Patient connected to nasal cannula oxygen  Post-op Assessment: Report given to PACU RN  Post vital signs: Reviewed and stable  Complications: No apparent anesthesia complications

## 2012-09-05 ENCOUNTER — Encounter (HOSPITAL_COMMUNITY): Payer: Self-pay | Admitting: Obstetrics and Gynecology

## 2012-09-05 NOTE — Op Note (Signed)
Natalie Brennan, Natalie Brennan              ACCOUNT NO.:  [id number]  MEDICAL RECORD NO.:  [id number]  LOCATION:  [facility]                          FACILITY:  [facility]  PHYSICIAN:  Juluis Mire, M.D.   DATE OF BIRTH:  1986-04-09  DATE OF PROCEDURE:  09/04/2012 DATE OF DISCHARGE:  09/04/2012                              OPERATIVE REPORT   PREOPERATIVE DIAGNOSES:  Chronic pelvic pain, menorrhagia.  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  PROCEDURE:  Hysteroscopy with endometrial curettings.  Diagnostic laparoscopy with cautery of endometriotic implant.  SURGEONS:  Juluis Mire, M.D.  ANESTHESIA:  General endotracheal.  ESTIMATED BLOOD LOSS:  Minimal.  PACKS AND DRAINS:  None.  INTRAOPERATIVE BLOOD PLACED:  None.  COMPLICATIONS:  None.  INDICATION:  Dictated in history and physical.  PROCEDURE IN DETAIL:  The patient was taken to the OR and placed in supine position.  After satisfactory level of general endotracheal anesthesia was obtained, the patient was placed in dorsal lithotomy position.  Using the [facility] stirrups, the abdomen, perineum, and vagina were prepped out with Betadine.  The patient was draped for hysteroscopy.  Spec was placed in vaginal vault.  Cervix was with grasped single-tooth tenaculum.  Uterus sounded to approximately 9 cm. Cervix was dilated to a size 29 Pratt dilator.  Hysteroscope was introduced.  Intrauterine cavity was distended using glycine. Visualization revealed completely normal endometrium.  No polyps or abnormalities were noted.  Both tubal ostia were easily visualized. Cystoscope was then removed.  Endometrial curettings were obtained.  The Hulka tenaculum  was put in place.  Single-tooth tenaculum speculum then removed.  The patient's bladder was emptied by in and out catheterization.  The patient was draped for laparoscopy.  Subumbilical incision made with a knife.  Veress needle was introduced into the  abdominal cavity. Abdomen was inflated with approximately 3.5 L of carbon dioxide.  A 10/11 trocar was inserted.  Laparoscope was introduced.  Visualization revealed no evidence of injury to adjacent organs.  A 5-mm trocar was put in place under direct visualization.  Visualization of the upper abdomen, liver, tip of the gallbladder were clear.  Appendix was unremarkable.  Uterus was elevated.  Implants of endometriosis were noted in the cul-de-sac and left pelvic sidewall in the form of the small red lesions.  There was some associated adhesions from these.  We brought in the bipolar.  Areas of endometriosis were cauterized.  We avoided the area of the ureter.  We then irrigated the pelvis, removed all irrigations, and we closely visualized throughout the pelvic cavity. There was no evidence of additional endometriosis.  Of note, the tubes and ovaries were not involved.  At this point in time, the abdomen was deflated with carbon dioxide.  All trocars removed.  Subumbilical incision closed with interrupted subcuticulars of 3-0 Vicryl. Suprapubic incision closed with Dermabond.  The Hulka tenaculum removed. The patient was taken out of the dorsal lithotomy position.  Once alert and extubated, transferred to recovery room in good condition.  Sponge, instrument, and needle count was correct by circulating nurse x2.     Juluis Mire, M.D.     JSM/MEDQ  D:  09/04/2012  T:  09/05/2012  Job:  [id number]

## 2013-08-16 ENCOUNTER — Encounter (HOSPITAL_COMMUNITY): Payer: Self-pay | Admitting: General Practice

## 2013-08-16 ENCOUNTER — Inpatient Hospital Stay (HOSPITAL_COMMUNITY): Payer: BC Managed Care – PPO

## 2013-08-16 ENCOUNTER — Inpatient Hospital Stay (HOSPITAL_COMMUNITY)
Admission: AD | Admit: 2013-08-16 | Discharge: 2013-08-16 | Disposition: A | Discharge status: Home / Self Care | Payer: BC Managed Care – PPO | Source: Ambulatory Visit | Attending: Obstetrics and Gynecology | Admitting: Obstetrics and Gynecology

## 2013-08-16 DIAGNOSIS — Z87442 Personal history of urinary calculi: Secondary | ICD-10-CM | POA: Insufficient documentation

## 2013-08-16 DIAGNOSIS — R319 Hematuria, unspecified: Secondary | ICD-10-CM | POA: Insufficient documentation

## 2013-08-16 DIAGNOSIS — N200 Calculus of kidney: Secondary | ICD-10-CM | POA: Insufficient documentation

## 2013-08-16 LAB — URINE MICROSCOPIC-ADD ON

## 2013-08-16 LAB — CBC
HCT: 37.9 % (ref 36.0–46.0)
Hemoglobin: 13.4 g/dL (ref 12.0–15.0)
MCH: 30.9 pg (ref 26.0–34.0)
MCHC: 35.4 g/dL (ref 30.0–36.0)
MCV: 87.5 fL (ref 78.0–100.0)
PLATELETS: 251 10*3/uL (ref 150–400)
RBC: 4.33 MIL/uL (ref 3.87–5.11)
RDW: 12.3 % (ref 11.5–15.5)
WBC: 10.9 10*3/uL — AB (ref 4.0–10.5)

## 2013-08-16 LAB — URINALYSIS, ROUTINE W REFLEX MICROSCOPIC
Bilirubin Urine: NEGATIVE
GLUCOSE, UA: NEGATIVE mg/dL
Ketones, ur: NEGATIVE mg/dL
Nitrite: NEGATIVE
PH: 6.5 (ref 5.0–8.0)
Protein, ur: NEGATIVE mg/dL
Urobilinogen, UA: 0.2 mg/dL (ref 0.0–1.0)

## 2013-08-16 MED ORDER — OXYCODONE-ACETAMINOPHEN 5-325 MG PO TABS: 2.0000 | ORAL_TABLET | ORAL | Status: DC | PRN

## 2013-08-16 MED ORDER — CIPROFLOXACIN HCL 500 MG PO TABS: 500.0000 mg | ORAL_TABLET | Freq: Two times a day (BID) | ORAL | Status: DC

## 2013-08-16 NOTE — MAU Note (Signed)
Patient states she has had a constant "girggling" in the stomach with diarrhea a couple of days ago, loose but not watery now. Started having right lower abdominal pain last night. Has a lot of nausea, no vomiting. Had chills last night, no fever. Blood in urine last night and again today, has clots of blood in the urine. Was sent from the office for evaluation. Has a history of kidney stones x 1.

## 2013-08-16 NOTE — MAU Provider Note (Signed)
History     CSN: [id number]  Arrival date and time: 08/16/13 1143   None     Chief Complaint  Patient presents with  . Hematuria  . Abdominal Pain   HPIpt is not pregnant and presents with sudden onset of right lower quadrant/lower back pain. Pt also has had pain/spasms at end of urination. Pt has hx of endometriosis and hx of kidney stones. Husband has had vasectomy Pt is midcycle LMP 07/31/2013  RN note: Patient states she has had a constant "girggling" in the stomach with diarrhea a couple of days ago, loose but not watery now. Started having right lower abdominal pain last night. Has a lot of nausea, no vomiting. Had chills last night, no fever. Blood in urine last night and again today, has clots of blood in the urine. Was sent from the office for evaluation. Has a history of kidney stones x 1.     Past Medical History  Diagnosis Date  . SVD (spontaneous vaginal delivery)     x 2  . Anemia     history    Past Surgical History  Procedure Laterality Date  . Wisdom tooth extraction    . Eye surgery      Lasik -bilateral eyes  . Dilatation & currettage/hysteroscopy with resectocope N/A 09/04/2012    Procedure: DILATATION & CURETTAGE/HYSTEROSCOPY WITH RESECTOCOPE;  Surgeon: Juluis MireJohn S McComb, MD;  Location: [facility];  Service: Gynecology;  Laterality: N/A;  . Laparoscopy N/A 09/04/2012    Procedure: LAPAROSCOPY OPERATIVE;  Surgeon: Juluis MireJohn S McComb, MD;  Location: [facility];  Service: Gynecology;  Laterality: N/A;    Family History  Problem Relation Age of Onset  . Depression Mother   . Alcohol abuse Father   . Early death Father   . Drug abuse Sister   . Drug abuse Brother   . Asthma Daughter   . Heart disease Paternal Grandfather     History  Substance Use Topics  . Smoking status: Never Smoker   . Smokeless tobacco: Never Used  . Alcohol Use: No    Allergies: No Known Allergies  Prescriptions prior to admission  Medication Sig Dispense Refill  . ibuprofen  (ADVIL,MOTRIN) 200 MG tablet Take 400 mg by mouth every 6 (six) hours as needed for headache.       . phentermine 37.5 MG capsule Take 37.5 mg by mouth daily as needed (for energy and weight loss).        ROS Physical Exam   Blood pressure 120/88, pulse 90, temperature 98.6 F (37 C), temperature source Oral, resp. rate 16, height 5\' 4"  (1.626 m), weight 63.685 kg (140 lb 6.4 oz), last menstrual period 07/31/2013, SpO2 100.00%.  Physical Exam  Nursing note and vitals reviewed. Constitutional: She is oriented to person, place, and time. She appears well-developed and well-nourished.  HENT:  Head: Normocephalic.  Eyes: Pupils are equal, round, and reactive to light.  Neck: Normal range of motion. Neck supple.  Cardiovascular: Normal rate.   Respiratory: Effort normal.  Right flank/lower back pain  GI: Soft. There is tenderness. There is no rebound.  Right lower abd pain with palpation; no rebound  Musculoskeletal: Normal range of motion.  Neurological: She is alert and oriented to person, place, and time.  Skin: Skin is warm and dry.  Psychiatric: She has a normal mood and affect.    MAU Course  Procedures Results for orders placed during the hospital encounter of 08/16/13 (from the past 24 hour(s))  URINALYSIS, ROUTINE  W REFLEX MICROSCOPIC     Status: Abnormal   Collection Time    08/16/13 12:10 PM      Result Value Ref Range   Color, Urine STRAW (*) YELLOW   APPearance CLEAR  CLEAR   Specific Gravity, Urine <1.005 (*) 1.005 - 1.030   pH 6.5  5.0 - 8.0   Glucose, UA NEGATIVE  NEGATIVE mg/dL   Hgb urine dipstick LARGE (*) NEGATIVE   Bilirubin Urine NEGATIVE  NEGATIVE   Ketones, ur NEGATIVE  NEGATIVE mg/dL   Protein, ur NEGATIVE  NEGATIVE mg/dL   Urobilinogen, UA 0.2  0.0 - 1.0 mg/dL   Nitrite NEGATIVE  NEGATIVE   Leukocytes, UA MODERATE (*) NEGATIVE  URINE MICROSCOPIC-ADD ON     Status: Abnormal   Collection Time    08/16/13 12:10 PM      Result Value Ref Range    Squamous Epithelial / LPF FEW (*) RARE   WBC, UA 7-10  <3 WBC/hpf   RBC / HPF 0-2  <3 RBC/hpf  CBC     Status: Abnormal   Collection Time    08/16/13  1:12 PM      Result Value Ref Range   WBC 10.9 (*) 4.0 - 10.5 K/uL   RBC 4.33  3.87 - 5.11 MIL/uL   Hemoglobin 13.4  12.0 - 15.0 g/dL   HCT 04.5  40.9 - 81.1 %   MCV 87.5  78.0 - 100.0 fL   MCH 30.9  26.0 - 34.0 pg   MCHC 35.4  30.0 - 36.0 g/dL   RDW 91.4  78.2 - 95.6 %   Platelets 251  150 - 400 K/uL  US Renal  08/16/2013   CLINICAL DATA:  Evaluate for renal stones.  EXAM: RENAL/URINARY TRACT ULTRASOUND COMPLETE  COMPARISON:  US TRANSVAGINAL NON-OB dated 01/20/2012  FINDINGS: Right Kidney:  Length: 10.3. Normal renal cortical echogenicity and thickness. No hydronephrosis. Diffusely echogenic renal pyramids.  Left Kidney:  Length: 10.5 cm. Normal renal cortical echogenicity and thickness. No hydronephrosis. Diffusely echogenic renal pyramids.  Bladder:  Somewhat decompressed however suggestion of mild wall thickening.  IMPRESSION: 1. Diffusely increased echogenicity of the renal pyramids bilaterally suggestive of medullary nephrocalcinosis. Common causative etiologies include hyperparathyroidism, renal tubular acidosis and medullary sponge kidney. 2. Mild circumferential urinary bladder wall thickening, nonspecific, however recommend correlation with urinalysis to exclude underlying cystitis.   Electronically Signed   By: Annia Belt M.D.   On: 08/16/2013 14:58  discussed with Dr. Arelia Sneddon Will send home with Cipro 500mg  BID for 7 days and Percocet for pain Dr. Lisbeth Ply office will call pt with urology referral  Assessment and Plan  Kidney stones Cipro 500mg  BID for 7 days Percocet  RX Jean Rosenthal, NP  Pamelia Hoit 08/16/2013, 1:52 PM

## 2013-08-18 LAB — URINE CULTURE

## 2014-04-08 ENCOUNTER — Encounter (HOSPITAL_COMMUNITY): Payer: Self-pay | Admitting: General Practice

## 2014-10-22 LAB — HEMOGLOBIN A1C: HEMOGLOBIN A1C: 5.3

## 2016-06-07 DIAGNOSIS — Z8719 Personal history of other diseases of the digestive system: Secondary | ICD-10-CM

## 2016-06-07 HISTORY — DX: Personal history of other diseases of the digestive system: Z87.19

## 2016-07-09 LAB — BASIC METABOLIC PANEL
BUN: 16 (ref 4–21)
Creatinine: 0.8 (ref <=1.1)
GLUCOSE: 91
Potassium: 4.3 (ref 3.4–5.3)
Sodium: 140 (ref 137–147)

## 2016-07-09 LAB — CBC AND DIFFERENTIAL
HEMATOCRIT: 39 (ref 36–46)
Hemoglobin: 13.3 (ref 12.0–16.0)
PLATELETS: 212 (ref 150–399)
WBC: 6.6

## 2016-07-09 LAB — HEPATIC FUNCTION PANEL
ALK PHOS: 47 (ref 25–125)
ALT: 14 (ref 7–35)
AST: 17 (ref 13–35)
BILIRUBIN, TOTAL: 0.4

## 2016-07-09 LAB — TSH: TSH: 2.35 (ref <=5.90)

## 2017-01-13 ENCOUNTER — Encounter: Payer: Self-pay | Admitting: Family Medicine

## 2017-01-13 ENCOUNTER — Ambulatory Visit
Admission: RE | Admit: 2017-01-13 | Discharge: 2017-01-13 | Disposition: A | Discharge status: Home / Self Care | Payer: No Typology Code available for payment source | Source: Ambulatory Visit | Attending: Family Medicine | Admitting: Family Medicine

## 2017-01-13 ENCOUNTER — Ambulatory Visit (INDEPENDENT_AMBULATORY_CARE_PROVIDER_SITE_OTHER): Payer: No Typology Code available for payment source | Admitting: Family Medicine

## 2017-01-13 VITALS — BP 109/74 | HR 81 | Temp 98.3°F | Resp 16 | Ht 64.0 in | Wt 144.8 lb

## 2017-01-13 DIAGNOSIS — R109 Unspecified abdominal pain: Secondary | ICD-10-CM

## 2017-01-13 DIAGNOSIS — R233 Spontaneous ecchymoses: Secondary | ICD-10-CM | POA: Diagnosis not present

## 2017-01-13 DIAGNOSIS — I7300 Raynaud's syndrome without gangrene: Secondary | ICD-10-CM

## 2017-01-13 DIAGNOSIS — K582 Mixed irritable bowel syndrome: Secondary | ICD-10-CM

## 2017-01-13 LAB — CBC WITH DIFFERENTIAL/PLATELET
BASOS ABS: 0 10*3/uL (ref 0.0–0.1)
BASOS PCT: 0.9 % (ref 0.0–3.0)
EOS ABS: 0.1 10*3/uL (ref 0.0–0.7)
Eosinophils Relative: 2.4 % (ref 0.0–5.0)
HCT: 41.3 % (ref 36.0–46.0)
Hemoglobin: 14.1 g/dL (ref 12.0–15.0)
Lymphocytes Relative: 34.1 % (ref 12.0–46.0)
Lymphs Abs: 1.8 10*3/uL (ref 0.7–4.0)
MCHC: 34.1 g/dL (ref 30.0–36.0)
MCV: 91.3 fl (ref 78.0–100.0)
Monocytes Absolute: 0.3 10*3/uL (ref 0.1–1.0)
Monocytes Relative: 6.7 % (ref 3.0–12.0)
NEUTROS ABS: 2.9 10*3/uL (ref 1.4–7.7)
NEUTROS PCT: 55.9 % (ref 43.0–77.0)
PLATELETS: 264 10*3/uL (ref 150.0–400.0)
RBC: 4.52 Mil/uL (ref 3.87–5.11)
RDW: 12.4 % (ref 11.5–15.5)
WBC: 5.2 10*3/uL (ref 4.0–10.5)

## 2017-01-13 LAB — COMPREHENSIVE METABOLIC PANEL
ALK PHOS: 50 U/L (ref 39–117)
ALT: 13 U/L (ref 0–35)
AST: 17 U/L (ref 0–37)
Albumin: 4.5 g/dL (ref 3.5–5.2)
BILIRUBIN TOTAL: 0.5 mg/dL (ref 0.2–1.2)
BUN: 11 mg/dL (ref 6–23)
CHLORIDE: 103 meq/L (ref 96–112)
CO2: 32 mEq/L (ref 19–32)
Calcium: 9.5 mg/dL (ref 8.4–10.5)
Creatinine, Ser: 0.81 mg/dL (ref 0.40–1.20)
GFR: 86.6 mL/min (ref >60.00)
GLUCOSE: 98 mg/dL (ref 70–99)
Potassium: 4.1 mEq/L (ref 3.5–5.1)
Sodium: 139 mEq/L (ref 135–145)
Total Protein: 7.1 g/dL (ref 6.0–8.3)

## 2017-01-13 LAB — IGA: IgA: 205 mg/dL (ref 68–378)

## 2017-01-13 LAB — C-REACTIVE PROTEIN: CRP: 0.2 mg/dL — ABNORMAL LOW (ref 0.5–20.0)

## 2017-01-13 LAB — PROTIME-INR
INR: 1 ratio (ref 0.8–1.0)
PROTHROMBIN TIME: 10.9 s (ref 9.6–13.1)

## 2017-01-13 LAB — SEDIMENTATION RATE: SED RATE: 1 mm/h (ref 0–20)

## 2017-01-13 MED ORDER — PANTOPRAZOLE SODIUM 40 MG PO TBEC: 40.0000 mg | DELAYED_RELEASE_TABLET | Freq: Every day | ORAL | 3 refills | Status: DC

## 2017-01-13 NOTE — Progress Notes (Signed)
Office Note 01/13/2017  CC:  Chief Complaint  Patient presents with  . Establish Care    Dr. Kathe Mariner from [facility] in [place]  . Rash  . Abdominal Pain    RUQ    HPI:  Natalie Brennan is a 31 y.o. female who is here to establish care and discuss RUQ pain. Patient's most recent primary MD: Dr. Tommi Emery at [facility]. Old records in EPIC/[facility] EMR were reviewed prior to or during today's visit.  LMP 12/24/16  Complaint: 3 mo history --feels a gurgling in xyphoid area, no pain.  Seldom has GERD sx's. Occ difficulty swallowing--random/rare.  No regurgitation.  No pain with swallowing.  Then, the last 4 weeks she has felt a pain under R rib cage, not constant or daily but about 3 times a week, with some nausea w/out vomiting and a burning sensation in stomach area.  This usually lasts about 2 hours, sometimes severe.  Occ occurs with eating and occ without eating---sometimes wakes her up from sleep.  Nothing consistently makes it worse or consistently relieves it. Hx of chronic episodes of constipa, occ urgent loose stools w/out abd pains---IBS type sx's.  No melena or hematochezia.  ?Stools look oily sometimes. She takes 2 advil per day for neck pain or HA.  Wants to discuss ongoing neck pain next f/u visit.  Past Medical History:  Diagnosis Date  . Endometriosis   . Frequent headaches   . Interstitial cystitis    Urologist dx in remote past--pt questions the accuracy of this.  . Kidney stones   . Recurrent UTI    About 4 per year  . SVD (spontaneous vaginal delivery)    x 2    Past Surgical History:  Procedure Laterality Date  . DILATATION & CURRETTAGE/HYSTEROSCOPY WITH RESECTOCOPE N/A 09/04/2012   Procedure: DILATATION & CURETTAGE/HYSTEROSCOPY WITH RESECTOCOPE;  Surgeon: Juluis Mire, MD;  Location: [facility];  Service: Gynecology;  Laterality: N/A;  . EYE SURGERY     Lasik -bilateral eyes  . LAPAROSCOPY N/A 09/04/2012   Procedure: LAPAROSCOPY OPERATIVE;  Surgeon: Juluis Mire, MD;   Location: [facility];  Service: Gynecology;  Laterality: N/A;  . WISDOM TOOTH EXTRACTION      Family History  Problem Relation Age of Onset  . Depression Mother   . Alcohol abuse Father   . Early death Father   . Cirrhosis Father   . Drug abuse Sister   . Drug abuse Brother   . Asthma Daughter   . Heart disease Paternal Grandfather   . Cancer Maternal Grandmother        Ovary/uterus  . Diabetes Maternal Grandmother   . Diabetes Paternal Grandmother     Social History   Social History  . Marital status: Married    Spouse name: N/A  . Number of children: N/A  . Years of education: N/A   Occupational History  . Not on file.   Social History Main Topics  . Smoking status: Never Smoker  . Smokeless tobacco: Never Used  . Alcohol use No  . Drug use: No  . Sexual activity: Yes    Birth control/ protection: None     Comment: husband has had a vasectomy   Other Topics Concern  . Not on file   Social History Narrative   Married, 1 son and 1 daughter.   Educ: associates degree   Occup: homemaker   No T/A/Ds.   MEDS: none  No Known Allergies  ROS Review of Systems  Constitutional: Negative for fatigue and fever.  HENT: Negative for congestion and sore throat.   Eyes: Negative for visual disturbance.  Respiratory: Negative for cough.   Cardiovascular: Negative for chest pain.  Gastrointestinal: Negative for abdominal pain and nausea.  Genitourinary: Negative for dysuria.  Musculoskeletal: Positive for arthralgias (neck pain). Negative for back pain and joint swelling.       Reports toes and hands with intermittent bluish hue.  NO cold sensation noted by pt.  Skin: Positive for rash (tiny little purplish red dots on legs and abdomen lately--dx'd as petechiae by her dermatologist).  Neurological: Negative for weakness and headaches.  Hematological: Negative for adenopathy.    PE; Blood pressure 109/74, pulse 81, temperature 98.3 F (36.8 C), temperature source  Tympanic, resp. rate 16, height 5\' 4"  (1.626 m), weight 144 lb 12.8 oz (65.7 kg), last menstrual period 12/24/2016, SpO2 99 %.  Pt examined with Wallace KellerHeather Kirby, CMA, as chaperone.  Gen: Alert, well appearing.  Patient is oriented to person, place, time, and situation. AFFECT: pleasant, lucid thought and speech. ZOX:WRUEENT:Eyes: no injection, icteris, swelling, or exudate.  EOMI, PERRLA. Mouth: lips without lesion/swelling.  Oral mucosa pink and moist. Oropharynx without erythema, exudate, or swelling.  Neck - No masses or thyromegaly or limitation in range of motion CV: RRR, no m/r/g.   LUNGS: CTA bilat, nonlabored resps, good aeration in all lung fields. ABD: soft, ND, no HSM, mass, or bruit.  BS normal.  Mild TTP in RUQ, R mid abd, and less so in RLQ.  No guarding or rebound tenderness.  Pertinent labs:  None today  No results found for: TSH Lab Results  Component Value Date   WBC 10.9 (H) 08/16/2013   HGB 13.4 08/16/2013   HCT 37.9 08/16/2013   MCV 87.5 08/16/2013   PLT 251 08/16/2013   Lab Results  Component Value Date   CREATININE 0.90 01/20/2012   BUN 8 01/20/2012   NA 140 01/20/2012   K 4.3 01/20/2012   CL 104 01/20/2012   CO2 25 11/12/2009   Lab Results  Component Value Date   ALT 39 (H) 01/20/2012   AST 34 01/20/2012   ALKPHOS 47 01/20/2012   BILITOT 0.4 01/20/2012   ASSESSMENT AND PLAN:   New pt; obtain pertinent old records.  1) Upper GI symptoms: suspect dyspepsia/gastritis--possibly with hiatal hernia. RUQ pain is still c/w this dx, but will check abd u/s to further eval liver/GB. Will start daily pantoprazole 40mg  qAM. Check CBC w/diff, CMET.  2) IBS suspected.  Sx's not severe enough to require trial of med at this time. No red flags for inflammatory bowel dz.  However, will do screening blood tests for celiac dz: TTG ab and IgA.  3) Petechial rash: unknown etiology: will obtain recent derm o/v notes and whatever labs they did. Will check CBC and PT/INR  today.  4) Periodic pallor/bluish hue to hands and toes: raynauds dz likely.  No w/u or treatment for this at the current time.  An After Visit Summary was printed and given to the patient.  Return in about 4 weeks (around 02/10/2017) for f/u abd.  Pt wants to discuss neck pain at a future visit.  Signed:  Santiago BumpersPhil McGowen, MD           01/13/2017

## 2017-01-17 ENCOUNTER — Encounter: Payer: Self-pay | Admitting: Family Medicine

## 2017-01-17 LAB — TISSUE TRANSGLUTAMINASE ABS,IGG,IGA
TISSUE TRANSGLUT AB: 2 U/mL (ref <6)
TISSUE TRANSGLUTAMINASE AB, IGA: 1 U/mL (ref <4)

## 2017-01-18 ENCOUNTER — Encounter: Payer: Self-pay | Admitting: *Deleted

## 2017-01-18 ENCOUNTER — Telehealth: Payer: Self-pay | Admitting: *Deleted

## 2017-01-18 ENCOUNTER — Encounter: Payer: Self-pay | Admitting: Family Medicine

## 2017-01-18 NOTE — Telephone Encounter (Signed)
[facility] [place]  I have reviewed records and abstracted information into pts chart.   Records put on Dr. Samul DadaMcGowen's desk for review.

## 2017-03-07 ENCOUNTER — Encounter: Payer: Self-pay | Admitting: Family Medicine

## 2017-05-18 DIAGNOSIS — R0602 Shortness of breath: Secondary | ICD-10-CM

## 2017-10-13 ENCOUNTER — Ambulatory Visit: Payer: Self-pay | Admitting: *Deleted

## 2017-10-13 NOTE — Telephone Encounter (Signed)
Called in c/o having intermittent sharp chest pain to the left of her breast bone and into left side of chest.     Denies other symptoms except dizziness and the sharp pain "stops me in my tracks" when it hits.   Was told 5 years ago by a cardiologist that she has a leaky heart valve but it was nothing to worry about.     I made her an appt with Dr. Milinda Cave for 10/14/17 at 8:45am.    I debated sending her to the ED but she informed me she could not go to the ED unless it was a real emergency.   She has  Her kids with her and they have stuff this evening they have to do.   "I can come into the doctor tomorrow"  "But I can't go to the ED today"..    I instructed her to go to the ED if she developed any s/s of cardiac trouble which I went over with her.   She verbalized understanding.  I routed a note to Dr. Milinda Cave.  Reason for Disposition . [1] Chest pain(s) lasting a few seconds AND [2] persists > 3 days  Answer Assessment - Initial Assessment Questions 1. LOCATION: "Where does it hurt?"       I have a leaky heart valve and not to worry about it.   To left of my breast bone and sharp pains in left side of chest.   2. RADIATION: "Does the pain go anywhere else?" (e.g., into neck, jaw, arms, back)     I have  A lot of inflammation in my neck and shoulders.   I'm using Ibuprofen but it doesn't help much.    The pain keeps coming back then goes away and repeats.    The sharp pains come from deep within. 3. ONSET: "When did the chest pain begin?" (Minutes, hours or days)      4 days the intense sharp I've had.    It takes my breath and comes on suddenly. 4. PATTERN "Does the pain come and go, or has it been constant since it started?"  "Does it get worse with exertion?"      The pain comes and goes.    I feel like my food is stuck in my chest.   I'm using Prilosec the last couple of days but I can't tell it has helped. 5. DURATION: "How long does it last" (e.g., seconds, minutes, hours)     The sharp  pains last quick stabbing and the stop. 6. SEVERITY: "How bad is the pain?"  (e.g., Scale 1-10; mild, moderate, or severe)    - MILD (1-3): doesn't interfere with normal activities     - MODERATE (4-7): interferes with normal activities or awakens from sleep    - SEVERE (8-10): excruciating pain, unable to do any normal activities       Sharp pain 8 on pain scale.   Today is was a 10.   It stopped me in my tracks.    Get short of breath when sharp pain happens. 7. CARDIAC RISK FACTORS: "Do you have any history of heart problems or risk factors for heart disease?" (e.g., prior heart attack, angina; high blood pressure, diabetes, being overweight, high cholesterol, smoking, or strong family history of heart disease)     Just the leaky heart valve I was told about 5 years ago by a cardiologist but I didn't need to worry about it. 8. PULMONARY RISK FACTORS: "  Do you have any history of lung disease?"  (e.g., blood clots in lung, asthma, emphysema, birth control pills)     No 9. CAUSE: "What do you think is causing the chest pain?"     Maybe inflammation from my neck.   I don't really know. 10. OTHER SYMPTOMS: "Do you have any other symptoms?" (e.g., dizziness, nausea, vomiting, sweating, fever, difficulty breathing, cough)       Dizziness with the sharp pains. 11. PREGNANCY: "Is there any chance you are pregnant?" "When was your last menstrual period?"       No.         Vasectomy  Protocols used: CHEST PAIN-A-AH

## 2017-10-13 NOTE — Telephone Encounter (Signed)
Please advise. Thanks.  

## 2017-10-13 NOTE — Telephone Encounter (Signed)
Noted. This is fine.

## 2017-10-14 ENCOUNTER — Encounter: Payer: Self-pay | Admitting: Family Medicine

## 2017-10-14 ENCOUNTER — Ambulatory Visit (INDEPENDENT_AMBULATORY_CARE_PROVIDER_SITE_OTHER): Payer: Self-pay | Admitting: Family Medicine

## 2017-10-14 VITALS — BP 120/79 | HR 75 | Temp 97.5°F | Resp 16 | Ht 64.0 in | Wt 147.0 lb

## 2017-10-14 DIAGNOSIS — E663 Overweight: Secondary | ICD-10-CM

## 2017-10-14 DIAGNOSIS — M940 Chondrocostal junction syndrome [Tietze]: Secondary | ICD-10-CM

## 2017-10-14 DIAGNOSIS — R0789 Other chest pain: Secondary | ICD-10-CM

## 2017-10-14 MED ORDER — ALPRAZOLAM 0.5 MG PO TABS: ORAL_TABLET | ORAL | 1 refills | Status: DC

## 2017-10-14 NOTE — Patient Instructions (Signed)
Take 2 aleve tabs twice a day with food x 10 days.  Take your prilosec every day while on the aleve.

## 2017-10-14 NOTE — Progress Notes (Signed)
OFFICE VISIT  10/14/2017   CC:  Chief Complaint  Patient presents with  . Chest Pain     HPI:    Patient is a 32 y.o. Caucasian female who presents for chest pains.  Onset 4-5 days ago. Having random brief bursts of sharp pain in chest just to the left of her sternum.  Feels a bit of tightness in the area for a few seconds after.  A hot flash comes with it, a little nausea as well.  A minute or so later she says she feels all sx's are gone.  A little lightheaded some lately, some mental fog.   No particular trigger, can happen any time of day.  Occurs at rest, with ambulation---either one. More stressed than normal.  Her husband is about to get a vasectomy reversal--this has her stressed. No recent URI or GE or fevers. Denies GERD sx's.  She had been taking prilosec prn. Says she has had some SOB for about a month, mainly when up moving around.  No panic attacks lately. No recent prolonged immobilization, no rx surgical procedure, no hx of DVT. Has palpitations --long term propblem, seems worse the last 1 mo.  Past Medical History:  Diagnosis Date  . Anxiety   . Chronic constipation   . Endometriosis   . Frequent headaches   . History of fatigue   . History of gastritis 2018  . History of gestational diabetes   . History of metrorrhagia   . IBS (irritable bowel syndrome)   . Interstitial cystitis    Urologist dx in remote past--pt questions the accuracy of this.  . 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 Raynaud's disease   . Recurrent UTI    About 4 per year  . Scoliosis   . SVD (spontaneous vaginal delivery)    x 2    Past Surgical History:  Procedure Laterality Date  . DILATATION & CURRETTAGE/HYSTEROSCOPY WITH RESECTOCOPE N/A 09/04/2012   Procedure: DILATATION & CURETTAGE/HYSTEROSCOPY WITH RESECTOCOPE;  Surgeon: Juluis Mire, MD;  Location: [facility];  Service: Gynecology;  Laterality: N/A;  . DOPPLER ECHOCARDIOGRAPHY  04/20/2012   EF  60-65%--All normal (Dr. Anne Fu).  . EYE SURGERY     Lasik -bilateral eyes  . LAPAROSCOPY N/A 09/04/2012   Procedure: LAPAROSCOPY OPERATIVE;  Surgeon: Juluis Mire, MD;  Location: [facility];  Service: Gynecology;  Laterality: N/A;  . WISDOM TOOTH EXTRACTION      Outpatient Medications Prior to Visit  Medication Sig Dispense Refill  . Ascorbic Acid (VITAMIN C PO) Take 1 tablet by mouth daily.    . Cyanocobalamin (VITAMIN B-12 PO) Take 1 tablet by mouth daily.    . Multiple Vitamin (MULTIVITAMIN) tablet Take 1 tablet by mouth daily.    Marland Kitchen omeprazole (PRILOSEC OTC) 20 MG tablet Take 20 mg by mouth daily.    . pantoprazole (PROTONIX) 40 MG tablet Take 1 tablet (40 mg total) by mouth daily. (Patient not taking: Reported on 10/14/2017) 30 tablet 3   No facility-administered medications prior to visit.     No Known Allergies  ROS As per HPI  PE: Blood pressure 120/79, pulse 75, temperature (!) 97.5 F (36.4 C), temperature source Oral, resp. rate 16, height  (1.626 m), weight 147 lb (66.7 kg), last menstrual period 09/19/2017, SpO2 100 %.  Exam chaperoned by Vanetta Mulders, CMA.  Gen: Alert, well appearing.  Patient is oriented to person, place, time, and situation. AFFECT: pleasant, lucid  thought and speech. UXL:KGMW: no injection, icteris, swelling, or exudate.  EOMI, PERRLA. Mouth: lips without lesion/swelling.  Oral mucosa pink and moist. Oropharynx without erythema, exudate, or swelling.  CV: RRR, no m/r/g.  Chest wall: she has tenderness along the costochondral junction to the L of the sternum at 3-4 levels.   LUNGS: CTA bilat, nonlabored resps, good aeration in all lung fields. EXT: no clubbing, cyanosis, or edema.    LABS:    Chemistry      Component Value Date/Time   NA 139 01/13/2017 1022   NA 140 07/09/2016   K 4.1 01/13/2017 1022   CL 103 01/13/2017 1022   CO2 32 01/13/2017 1022   BUN 11 01/13/2017 1022   BUN 16 07/09/2016   CREATININE 0.81 01/13/2017 1022   GLU 91  07/09/2016      Component Value Date/Time   CALCIUM 9.5 01/13/2017 1022   ALKPHOS 50 01/13/2017 1022   AST 17 01/13/2017 1022   ALT 13 01/13/2017 1022   BILITOT 0.5 01/13/2017 1022     Lab Results  Component Value Date   WBC 5.2 01/13/2017   HGB 14.1 01/13/2017   HCT 41.3 01/13/2017   MCV 91.3 01/13/2017   PLT 264.0 01/13/2017   Lab Results  Component Value Date   TSH 2.35 07/09/2016   Lab Results  Component Value Date   HGBA1C 5.3 10/22/2014   IMPRESSION AND PLAN:  1) Atypical chest pain, suspect chest wall pain from costochondritis. Discussed dx with pt, reassured her that this is a problem that will eventually run its course. Recommended she continue to take prilosec daily, take aleve 440 mg bid with food x 10d--aleve samples given to pt today.  2) Situational anxiety: she has been on alprazolam in the past, has used it sparingly. I rx'd alprazolam 0.5mg , 1 bid prn, #60, RF x 1.   Spent 25 min with pt today, with >50% of this time spent in counseling and care coordination regarding the above problems.  An After Visit Summary was printed and given to the patient.   FOLLOW UP: Return if symptoms worsen or fail to improve.  Signed:  Santiago Bumpers, MD           10/14/2017

## 2018-01-04 ENCOUNTER — Ambulatory Visit (INDEPENDENT_AMBULATORY_CARE_PROVIDER_SITE_OTHER): Payer: Self-pay | Admitting: Family Medicine

## 2018-01-04 ENCOUNTER — Encounter: Payer: Self-pay | Admitting: *Deleted

## 2018-01-04 ENCOUNTER — Encounter: Payer: Self-pay | Admitting: Family Medicine

## 2018-01-04 VITALS — BP 111/72 | HR 74 | Temp 97.8°F | Resp 16 | Ht 64.0 in | Wt 144.0 lb

## 2018-01-04 DIAGNOSIS — Z3491 Encounter for supervision of normal pregnancy, unspecified, first trimester: Secondary | ICD-10-CM

## 2018-01-04 LAB — POCT URINE PREGNANCY: Preg Test, Ur: POSITIVE — AB

## 2018-01-04 NOTE — Progress Notes (Signed)
OFFICE VISIT  01/04/2018   CC:  Chief Complaint  Patient presents with  . Amenorrhea    positive home pregnancy test   HPI:    Patient is a 32 y.o.  female who presents for question of being pregnant. Husband had reversal of his vasectomy at end of 10/2017. Her LMP was 11/16/17--4 days long and shorter than her usual. Multiple + pregnancy tests at home. GYN is Dr. Richardean ChimeraJohn McComb.  She feels nauseous and tired, otherwise fine.  No vomiting. No vag spotting/bleeding. Forcing herself to eat does help nausea. Started PNV recently. Has been taking TUMS and this helps her dyspepsia/GERD sx's some since being off her PPI.  Past Medical History:  Diagnosis Date  . Anxiety   . Chronic constipation   . Endometriosis   . Frequent headaches   . History of fatigue   . History of gastritis 2018  . History of gestational diabetes   . History of metrorrhagia   . IBS (irritable bowel syndrome)   . Interstitial cystitis    Urologist dx in remote past--pt questions the accuracy of this.  . 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 Raynaud's disease   . Recurrent UTI    About 4 per year  . Scoliosis   . SVD (spontaneous vaginal delivery)    x 2    Past Surgical History:  Procedure Laterality Date  . DILATATION & CURRETTAGE/HYSTEROSCOPY WITH RESECTOCOPE N/A 09/04/2012   Procedure: DILATATION & CURETTAGE/HYSTEROSCOPY WITH RESECTOCOPE;  Surgeon: Juluis MireJohn S McComb, MD;  Location: [facility];  Service: Gynecology;  Laterality: N/A;  . DOPPLER ECHOCARDIOGRAPHY  04/20/2012   EF 60-65%--All normal (Dr. Anne FuSkains).  . EYE SURGERY     Lasik -bilateral eyes  . LAPAROSCOPY N/A 09/04/2012   Procedure: LAPAROSCOPY OPERATIVE;  Surgeon: Juluis MireJohn S McComb, MD;  Location: [facility];  Service: Gynecology;  Laterality: N/A;  . WISDOM TOOTH EXTRACTION     MEDS: She is taking PNV only.  Outpatient Medications Prior to Visit  Medication Sig Dispense Refill  . Prenatal Vit-Fe Fumarate-FA (PRENATAL  VITAMIN PO) Take by mouth.    . ALPRAZolam (XANAX) 0.5 MG tablet 1 bid prn anxiety (Patient not taking: Reported on 01/04/2018) 60 tablet 1  . Ascorbic Acid (VITAMIN C PO) Take 1 tablet by mouth daily.    . Cyanocobalamin (VITAMIN B-12 PO) Take 1 tablet by mouth daily.    . Multiple Vitamin (MULTIVITAMIN) tablet Take 1 tablet by mouth daily.    Marland Kitchen. omeprazole (PRILOSEC OTC) 20 MG tablet Take 20 mg by mouth daily.     No facility-administered medications prior to visit.     No Known Allergies  ROS As per HPI  PE: Blood pressure 111/72, pulse 74, temperature 97.8 F (36.6 C), temperature source Oral, resp. rate 16, height 5\' 4"  (1.626 m), weight 144 lb (65.3 kg), last menstrual period 11/16/2017, SpO2 100 %. Pt examined with Pryor OchoaHeather Sutherland, CMA, as chaperone. Gen: Alert, well appearing.  Patient is oriented to person, place, time, and situation. AFFECT: pleasant, lucid thought and speech. CV: RRR, no m/r/g.   LUNGS: CTA bilat, nonlabored resps, good aeration in all lung fields. ABD: soft, NT, ND, BS normal.  No hepatospenomegaly or mass.  No bruits. EXT: no clubbing, cyanosis, or edema.   LABS:   UPT today: POSITIVE \   Chemistry      Component Value Date/Time   NA 139 01/13/2017 1022   NA 140 07/09/2016   K  4.1 01/13/2017 1022   CL 103 01/13/2017 1022   CO2 32 01/13/2017 1022   BUN 11 01/13/2017 1022   BUN 16 07/09/2016   CREATININE 0.81 01/13/2017 1022   GLU 91 07/09/2016      Component Value Date/Time   CALCIUM 9.5 01/13/2017 1022   ALKPHOS 50 01/13/2017 1022   AST 17 01/13/2017 1022   ALT 13 01/13/2017 1022   BILITOT 0.5 01/13/2017 1022      IMPRESSION AND PLAN:  1) Pregnancy, 1st trimester. Pt doing well. She'll continue on PNV and make contact with her OB MD at her convenience over the next few weeks or so.  An After Visit Summary was printed and given to the patient.  FOLLOW UP: Return for AS NEEDED.  Signed:  Santiago Bumpers, MD            01/04/2018

## 2018-01-04 NOTE — Patient Instructions (Signed)
°  CONGRATULATIONS!!!!!!!!!!!!!!!!!!!!!!!!!!!!!!!!!!!! °

## 2018-01-17 DIAGNOSIS — Z3481 Encounter for supervision of other normal pregnancy, first trimester: Secondary | ICD-10-CM | POA: Diagnosis not present

## 2018-01-17 DIAGNOSIS — Z3685 Encounter for antenatal screening for Streptococcus B: Secondary | ICD-10-CM | POA: Diagnosis not present

## 2018-01-17 DIAGNOSIS — Z3A08 8 weeks gestation of pregnancy: Secondary | ICD-10-CM | POA: Diagnosis not present

## 2018-01-17 LAB — OB RESULTS CONSOLE HEPATITIS B SURFACE ANTIGEN: Hepatitis B Surface Ag: NEGATIVE

## 2018-01-17 LAB — OB RESULTS CONSOLE HIV ANTIBODY (ROUTINE TESTING): HIV: NONREACTIVE

## 2018-01-17 LAB — OB RESULTS CONSOLE RPR: RPR: NONREACTIVE

## 2018-01-17 LAB — OB RESULTS CONSOLE ABO/RH: RH Type: NEGATIVE

## 2018-01-17 LAB — OB RESULTS CONSOLE RUBELLA ANTIBODY, IGM: Rubella: IMMUNE

## 2018-02-01 DIAGNOSIS — Z3A11 11 weeks gestation of pregnancy: Secondary | ICD-10-CM | POA: Diagnosis not present

## 2018-02-07 DIAGNOSIS — H5213 Myopia, bilateral: Secondary | ICD-10-CM | POA: Diagnosis not present

## 2018-02-13 DIAGNOSIS — Z3A12 12 weeks gestation of pregnancy: Secondary | ICD-10-CM | POA: Diagnosis not present

## 2018-02-13 DIAGNOSIS — Z3491 Encounter for supervision of normal pregnancy, unspecified, first trimester: Secondary | ICD-10-CM | POA: Diagnosis not present

## 2018-02-13 DIAGNOSIS — Z3682 Encounter for antenatal screening for nuchal translucency: Secondary | ICD-10-CM | POA: Diagnosis not present

## 2018-03-17 DIAGNOSIS — Z3A17 17 weeks gestation of pregnancy: Secondary | ICD-10-CM | POA: Diagnosis not present

## 2018-03-23 DIAGNOSIS — Z3A18 18 weeks gestation of pregnancy: Secondary | ICD-10-CM | POA: Diagnosis not present

## 2018-03-29 DIAGNOSIS — Z3A19 19 weeks gestation of pregnancy: Secondary | ICD-10-CM | POA: Diagnosis not present

## 2018-03-29 DIAGNOSIS — Z363 Encounter for antenatal screening for malformations: Secondary | ICD-10-CM | POA: Diagnosis not present

## 2018-03-30 DIAGNOSIS — Z3A19 19 weeks gestation of pregnancy: Secondary | ICD-10-CM | POA: Diagnosis not present

## 2018-03-30 DIAGNOSIS — Z3480 Encounter for supervision of other normal pregnancy, unspecified trimester: Secondary | ICD-10-CM | POA: Diagnosis not present

## 2018-04-06 DIAGNOSIS — Z3A20 20 weeks gestation of pregnancy: Secondary | ICD-10-CM | POA: Diagnosis not present

## 2018-04-13 DIAGNOSIS — Z3A22 22 weeks gestation of pregnancy: Secondary | ICD-10-CM | POA: Diagnosis not present

## 2018-04-20 DIAGNOSIS — Z3A22 22 weeks gestation of pregnancy: Secondary | ICD-10-CM | POA: Diagnosis not present

## 2018-04-27 DIAGNOSIS — E039 Hypothyroidism, unspecified: Secondary | ICD-10-CM | POA: Diagnosis not present

## 2018-04-27 DIAGNOSIS — Z3A23 23 weeks gestation of pregnancy: Secondary | ICD-10-CM | POA: Diagnosis not present

## 2018-04-27 DIAGNOSIS — Z139 Encounter for screening, unspecified: Secondary | ICD-10-CM | POA: Diagnosis not present

## 2018-05-03 DIAGNOSIS — Z3A24 24 weeks gestation of pregnancy: Secondary | ICD-10-CM | POA: Diagnosis not present

## 2018-05-10 DIAGNOSIS — Z3A25 25 weeks gestation of pregnancy: Secondary | ICD-10-CM | POA: Diagnosis not present

## 2018-05-17 DIAGNOSIS — Z3A26 26 weeks gestation of pregnancy: Secondary | ICD-10-CM | POA: Diagnosis not present

## 2018-05-22 DIAGNOSIS — Z3A26 26 weeks gestation of pregnancy: Secondary | ICD-10-CM | POA: Diagnosis not present

## 2018-05-23 DIAGNOSIS — Z23 Encounter for immunization: Secondary | ICD-10-CM | POA: Diagnosis not present

## 2018-05-23 DIAGNOSIS — Z3A27 27 weeks gestation of pregnancy: Secondary | ICD-10-CM | POA: Diagnosis not present

## 2018-05-23 DIAGNOSIS — Z3480 Encounter for supervision of other normal pregnancy, unspecified trimester: Secondary | ICD-10-CM | POA: Diagnosis not present

## 2018-05-30 DIAGNOSIS — Z3A28 28 weeks gestation of pregnancy: Secondary | ICD-10-CM | POA: Diagnosis not present

## 2018-06-06 DIAGNOSIS — Z3A29 29 weeks gestation of pregnancy: Secondary | ICD-10-CM | POA: Diagnosis not present

## 2018-06-07 NOTE — L&D Delivery Note (Signed)
Delivery Note  SVD viable female Apgars 9,9 over intact perineum.  Placenta delivered spontaneously intact with 3VC. Good support and hemostasis noted.  R/V exam confirms.  MEU WNL.   Plan IV TXA due to past hx of PPH   Mother and baby to couplet care and are doing well.  EBL 100cc  Candice Camp, MD

## 2018-06-13 DIAGNOSIS — Z3A30 30 weeks gestation of pregnancy: Secondary | ICD-10-CM | POA: Diagnosis not present

## 2018-06-20 DIAGNOSIS — Z3A31 31 weeks gestation of pregnancy: Secondary | ICD-10-CM | POA: Diagnosis not present

## 2018-06-28 DIAGNOSIS — Z3A33 33 weeks gestation of pregnancy: Secondary | ICD-10-CM | POA: Diagnosis not present

## 2018-07-04 DIAGNOSIS — Z3A33 33 weeks gestation of pregnancy: Secondary | ICD-10-CM | POA: Diagnosis not present

## 2018-07-12 DIAGNOSIS — Z3A34 34 weeks gestation of pregnancy: Secondary | ICD-10-CM | POA: Diagnosis not present

## 2018-07-12 DIAGNOSIS — Z8751 Personal history of pre-term labor: Secondary | ICD-10-CM | POA: Diagnosis not present

## 2018-07-18 DIAGNOSIS — Z3A35 35 weeks gestation of pregnancy: Secondary | ICD-10-CM | POA: Diagnosis not present

## 2018-07-25 DIAGNOSIS — Z3A35 35 weeks gestation of pregnancy: Secondary | ICD-10-CM | POA: Diagnosis not present

## 2018-07-25 DIAGNOSIS — O09219 Supervision of pregnancy with history of pre-term labor, unspecified trimester: Secondary | ICD-10-CM | POA: Diagnosis not present

## 2018-07-25 DIAGNOSIS — Z3685 Encounter for antenatal screening for Streptococcus B: Secondary | ICD-10-CM | POA: Diagnosis not present

## 2018-07-31 LAB — OB RESULTS CONSOLE GBS: GBS: POSITIVE

## 2018-08-15 ENCOUNTER — Inpatient Hospital Stay (HOSPITAL_COMMUNITY): Payer: Medicaid Other | Admitting: Anesthesiology

## 2018-08-15 ENCOUNTER — Inpatient Hospital Stay (HOSPITAL_COMMUNITY)
Admission: AD | Admit: 2018-08-15 | Discharge: 2018-08-17 | DRG: 807 | Disposition: A | Discharge status: Home / Self Care | Payer: Medicaid Other | Attending: Obstetrics and Gynecology | Admitting: Obstetrics and Gynecology

## 2018-08-15 ENCOUNTER — Other Ambulatory Visit: Payer: Self-pay

## 2018-08-15 ENCOUNTER — Encounter (HOSPITAL_COMMUNITY): Payer: Self-pay

## 2018-08-15 DIAGNOSIS — Z3A38 38 weeks gestation of pregnancy: Secondary | ICD-10-CM

## 2018-08-15 DIAGNOSIS — O4292 Full-term premature rupture of membranes, unspecified as to length of time between rupture and onset of labor: Principal | ICD-10-CM | POA: Diagnosis present

## 2018-08-15 DIAGNOSIS — O99824 Streptococcus B carrier state complicating childbirth: Secondary | ICD-10-CM | POA: Diagnosis present

## 2018-08-15 LAB — CBC
HCT: 36.4 % (ref 36.0–46.0)
Hemoglobin: 12.5 g/dL (ref 12.0–15.0)
MCH: 32.5 pg (ref 26.0–34.0)
MCHC: 34.3 g/dL (ref 30.0–36.0)
MCV: 94.5 fL (ref 80.0–100.0)
Platelets: 188 10*3/uL (ref 150–400)
RBC: 3.85 MIL/uL — ABNORMAL LOW (ref 3.87–5.11)
RDW: 13.4 % (ref 11.5–15.5)
WBC: 12.4 10*3/uL — ABNORMAL HIGH (ref 4.0–10.5)
nRBC: 0 % (ref 0.0–0.2)

## 2018-08-15 LAB — ABO/RH: ABO/RH(D): O NEG

## 2018-08-15 LAB — TYPE AND SCREEN
ABO/RH(D): O NEG
Antibody Screen: NEGATIVE

## 2018-08-15 LAB — RPR: RPR Ser Ql: NONREACTIVE

## 2018-08-15 MED ORDER — DIBUCAINE 1 % RE OINT: 1.0000 "application " | TOPICAL_OINTMENT | RECTAL | Status: DC | PRN

## 2018-08-15 MED ORDER — OXYTOCIN 40 UNITS IN NORMAL SALINE INFUSION - SIMPLE MED
1.0000 m[IU]/min | INTRAVENOUS | Status: DC
  Administered 2018-08-15: 2 m[IU]/min via INTRAVENOUS
  Filled 2018-08-15: qty 1000

## 2018-08-15 MED ORDER — LACTATED RINGERS IV SOLN: 500.0000 mL | Freq: Once | INTRAVENOUS | Status: DC

## 2018-08-15 MED ORDER — IBUPROFEN 600 MG PO TABS
600.0000 mg | ORAL_TABLET | Freq: Four times a day (QID) | ORAL | Status: DC
  Administered 2018-08-15 – 2018-08-17 (×8): 600 mg via ORAL
  Filled 2018-08-15 (×8): qty 1

## 2018-08-15 MED ORDER — ONDANSETRON HCL 4 MG/2ML IJ SOLN: 4.0000 mg | INTRAMUSCULAR | Status: DC | PRN

## 2018-08-15 MED ORDER — ACETAMINOPHEN 325 MG PO TABS: 650.0000 mg | ORAL_TABLET | ORAL | Status: DC | PRN

## 2018-08-15 MED ORDER — SODIUM CHLORIDE 0.9 % IV SOLN
5.0000 10*6.[IU] | Freq: Once | INTRAVENOUS | Status: AC
  Administered 2018-08-15: 5 10*6.[IU] via INTRAVENOUS
  Filled 2018-08-15 (×3): qty 5

## 2018-08-15 MED ORDER — DIPHENHYDRAMINE HCL 50 MG/ML IJ SOLN: 12.5000 mg | INTRAMUSCULAR | Status: DC | PRN

## 2018-08-15 MED ORDER — TRANEXAMIC ACID 1000 MG/10ML IV SOLN: 1000.0000 mg | Freq: Once | INTRAVENOUS | Status: DC

## 2018-08-15 MED ORDER — LIDOCAINE HCL (PF) 1 % IJ SOLN: 30.0000 mL | INTRAMUSCULAR | Status: DC | PRN

## 2018-08-15 MED ORDER — OXYCODONE-ACETAMINOPHEN 5-325 MG PO TABS
1.0000 | ORAL_TABLET | ORAL | Status: DC | PRN
  Administered 2018-08-16 – 2018-08-17 (×5): 1 via ORAL
  Filled 2018-08-15 (×5): qty 1

## 2018-08-15 MED ORDER — TERBUTALINE SULFATE 1 MG/ML IJ SOLN: 0.2500 mg | Freq: Once | INTRAMUSCULAR | Status: DC | PRN

## 2018-08-15 MED ORDER — ONDANSETRON HCL 4 MG/2ML IJ SOLN: 4.0000 mg | Freq: Four times a day (QID) | INTRAMUSCULAR | Status: DC | PRN

## 2018-08-15 MED ORDER — SOD CITRATE-CITRIC ACID 500-334 MG/5ML PO SOLN: 30.0000 mL | ORAL | Status: DC | PRN

## 2018-08-15 MED ORDER — OXYTOCIN BOLUS FROM INFUSION
500.0000 mL | Freq: Once | INTRAVENOUS | Status: AC
  Administered 2018-08-15: 500 mL via INTRAVENOUS

## 2018-08-15 MED ORDER — COCONUT OIL OIL
1.0000 "application " | TOPICAL_OIL | Status: DC | PRN
  Administered 2018-08-16: 1 via TOPICAL

## 2018-08-15 MED ORDER — BENZOCAINE-MENTHOL 20-0.5 % EX AERO
1.0000 "application " | INHALATION_SPRAY | CUTANEOUS | Status: DC | PRN
  Administered 2018-08-15 – 2018-08-16 (×2): 1 via TOPICAL
  Filled 2018-08-15 (×2): qty 56

## 2018-08-15 MED ORDER — LIDOCAINE HCL (PF) 1 % IJ SOLN
INTRAMUSCULAR | Status: DC | PRN
  Administered 2018-08-15: 11 mL via EPIDURAL

## 2018-08-15 MED ORDER — FLEET ENEMA 7-19 GM/118ML RE ENEM: 1.0000 | ENEMA | RECTAL | Status: DC | PRN

## 2018-08-15 MED ORDER — WITCH HAZEL-GLYCERIN EX PADS: 1.0000 "application " | MEDICATED_PAD | CUTANEOUS | Status: DC | PRN

## 2018-08-15 MED ORDER — ONDANSETRON HCL 4 MG PO TABS: 4.0000 mg | ORAL_TABLET | ORAL | Status: DC | PRN

## 2018-08-15 MED ORDER — TETANUS-DIPHTH-ACELL PERTUSSIS 5-2.5-18.5 LF-MCG/0.5 IM SUSP: 0.5000 mL | Freq: Once | INTRAMUSCULAR | Status: DC

## 2018-08-15 MED ORDER — TRANEXAMIC ACID-NACL 1000-0.7 MG/100ML-% IV SOLN
1000.0000 mg | Freq: Once | INTRAVENOUS | Status: AC
  Administered 2018-08-15: 1000 mg via INTRAVENOUS
  Filled 2018-08-15: qty 100

## 2018-08-15 MED ORDER — FENTANYL-BUPIVACAINE-NACL 0.5-0.125-0.9 MG/250ML-% EP SOLN
12.0000 mL/h | EPIDURAL | Status: DC | PRN
  Filled 2018-08-15: qty 250

## 2018-08-15 MED ORDER — LACTATED RINGERS IV SOLN
INTRAVENOUS | Status: DC
  Administered 2018-08-15 (×2): via INTRAVENOUS

## 2018-08-15 MED ORDER — OXYTOCIN 40 UNITS IN NORMAL SALINE INFUSION - SIMPLE MED: 2.5000 [IU]/h | INTRAVENOUS | Status: DC

## 2018-08-15 MED ORDER — OXYCODONE-ACETAMINOPHEN 5-325 MG PO TABS
2.0000 | ORAL_TABLET | ORAL | Status: DC | PRN
  Administered 2018-08-16: 2 via ORAL
  Filled 2018-08-15: qty 2

## 2018-08-15 MED ORDER — OXYCODONE-ACETAMINOPHEN 5-325 MG PO TABS: 2.0000 | ORAL_TABLET | ORAL | Status: DC | PRN

## 2018-08-15 MED ORDER — MEASLES, MUMPS & RUBELLA VAC IJ SOLR: 0.5000 mL | Freq: Once | INTRAMUSCULAR | Status: DC

## 2018-08-15 MED ORDER — ZOLPIDEM TARTRATE 5 MG PO TABS: 5.0000 mg | ORAL_TABLET | Freq: Every evening | ORAL | Status: DC | PRN

## 2018-08-15 MED ORDER — SIMETHICONE 80 MG PO CHEW: 80.0000 mg | CHEWABLE_TABLET | ORAL | Status: DC | PRN

## 2018-08-15 MED ORDER — SENNOSIDES-DOCUSATE SODIUM 8.6-50 MG PO TABS
2.0000 | ORAL_TABLET | ORAL | Status: DC
  Administered 2018-08-15 – 2018-08-16 (×2): 2 via ORAL
  Filled 2018-08-15 (×2): qty 2

## 2018-08-15 MED ORDER — SODIUM CHLORIDE (PF) 0.9 % IJ SOLN
INTRAMUSCULAR | Status: DC | PRN
  Administered 2018-08-15: 12 mL/h via EPIDURAL

## 2018-08-15 MED ORDER — PRENATAL MULTIVITAMIN CH
1.0000 | ORAL_TABLET | Freq: Every day | ORAL | Status: DC
  Administered 2018-08-16 – 2018-08-17 (×2): 1 via ORAL
  Filled 2018-08-15 (×2): qty 1

## 2018-08-15 MED ORDER — PHENYLEPHRINE 40 MCG/ML (10ML) SYRINGE FOR IV PUSH (FOR BLOOD PRESSURE SUPPORT): 80.0000 ug | PREFILLED_SYRINGE | INTRAVENOUS | Status: DC | PRN

## 2018-08-15 MED ORDER — EPHEDRINE 5 MG/ML INJ: 10.0000 mg | INTRAVENOUS | Status: DC | PRN

## 2018-08-15 MED ORDER — DIPHENHYDRAMINE HCL 25 MG PO CAPS: 25.0000 mg | ORAL_CAPSULE | Freq: Four times a day (QID) | ORAL | Status: DC | PRN

## 2018-08-15 MED ORDER — OXYCODONE-ACETAMINOPHEN 5-325 MG PO TABS: 1.0000 | ORAL_TABLET | ORAL | Status: DC | PRN

## 2018-08-15 MED ORDER — LACTATED RINGERS IV SOLN: 500.0000 mL | INTRAVENOUS | Status: DC | PRN

## 2018-08-15 MED ORDER — MEDROXYPROGESTERONE ACETATE 150 MG/ML IM SUSP: 150.0000 mg | INTRAMUSCULAR | Status: DC | PRN

## 2018-08-15 MED ORDER — PENICILLIN G 3 MILLION UNITS IVPB - SIMPLE MED
3.0000 10*6.[IU] | INTRAVENOUS | Status: DC
  Administered 2018-08-15 (×2): 3 10*6.[IU] via INTRAVENOUS
  Filled 2018-08-15 (×3): qty 100

## 2018-08-15 NOTE — Anesthesia Preprocedure Evaluation (Addendum)
Anesthesia Evaluation  Patient identified by MRN, date of birth, ID band Patient awake    Reviewed: Allergy & Precautions, H&P , Patient's Chart, lab work & pertinent test results, reviewed documented beta blocker date and time   History of Anesthesia Complications (+) PONV and history of anesthetic complications  Airway Mallampati: II  TM Distance: >3 FB Neck ROM: full    Dental no notable dental hx.    Pulmonary    Pulmonary exam normal breath sounds clear to auscultation       Cardiovascular  Rhythm:regular Rate:Normal     Neuro/Psych    GI/Hepatic   Endo/Other    Renal/GU      Musculoskeletal   Abdominal   Peds  Hematology   Anesthesia Other Findings   Reproductive/Obstetrics (+) Pregnancy                             Anesthesia Physical  Anesthesia Plan  ASA: II  Anesthesia Plan: Epidural   Post-op Pain Management:    Induction:   PONV Risk Score and Plan:   Airway Management Planned:   Additional Equipment:   Intra-op Plan:   Post-operative Plan:   Informed Consent: I have reviewed the patients History and Physical, chart, labs and discussed the procedure including the risks, benefits and alternatives for the proposed anesthesia with the patient or authorized representative who has indicated his/her understanding and acceptance.       Plan Discussed with: CRNA and Surgeon  Anesthesia Plan Comments: (  )        Anesthesia Quick Evaluation

## 2018-08-15 NOTE — Anesthesia Postprocedure Evaluation (Signed)
Anesthesia Post Note  Patient: Natalie Brennan  Procedure(s) Performed: AN AD HOC LABOR EPIDURAL     Patient location during evaluation: Mother Baby Anesthesia Type: Epidural Level of consciousness: awake and alert Pain management: pain level controlled Vital Signs Assessment: post-procedure vital signs reviewed and stable Respiratory status: spontaneous breathing, nonlabored ventilation and respiratory function stable Cardiovascular status: stable Postop Assessment: no headache, no backache and epidural receding Anesthetic complications: no    Last Vitals:  Vitals:   08/15/18 1815 08/15/18 1920  BP: 121/71 115/80  Pulse: 82 87  Resp: 18 18  Temp: 36.8 C 37.1 C  SpO2: 100% 99%    Last Pain:  Vitals:   08/15/18 1920  TempSrc: Oral  PainSc: 0-No pain   Pain Goal:                   Aunya Lemler

## 2018-08-15 NOTE — H&P (Signed)
Natalie Brennan is a 33 y.o. female presenting for SROM/PROM at term.  Pregnancy uncomplicated.  Hx of PTD on Prog therapy this pregnancy.  GBS +.  Also hx of PP atony last pregnancy. OB History    Gravida  4   Para  2   Term      Preterm  2   AB  1   Living  2     SAB  1   TAB      Ectopic      Multiple      Live Births  2          Past Medical History:  Diagnosis Date  . Anxiety   . Chronic constipation   . Endometriosis   . Frequent headaches   . History of fatigue   . History of gastritis 2018  . History of gestational diabetes   . History of metrorrhagia   . IBS (irritable bowel syndrome)   . Interstitial cystitis    Urologist dx in remote past--pt questions the accuracy of this.  . 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 Raynaud's disease   . Recurrent UTI    About 4 per year  . Scoliosis   . SVD (spontaneous vaginal delivery)    x 2   Past Surgical History:  Procedure Laterality Date  . DILATATION & CURRETTAGE/HYSTEROSCOPY WITH RESECTOCOPE N/A 09/04/2012   Procedure: DILATATION & CURETTAGE/HYSTEROSCOPY WITH RESECTOCOPE;  Surgeon: Juluis Mire, MD;  Location: [facility];  Service: Gynecology;  Laterality: N/A;  . DOPPLER ECHOCARDIOGRAPHY  04/20/2012   EF 60-65%--All normal (Dr. Anne Fu).  . EYE SURGERY     Lasik -bilateral eyes  . LAPAROSCOPY N/A 09/04/2012   Procedure: LAPAROSCOPY OPERATIVE;  Surgeon: Juluis Mire, MD;  Location: [facility];  Service: Gynecology;  Laterality: N/A;  . WISDOM TOOTH EXTRACTION     Family History: family history includes Alcohol abuse in her father; Asthma in her daughter; Cancer in her maternal grandmother; Cirrhosis in her father; Depression in her mother; Diabetes in her maternal grandmother and paternal grandmother; Drug abuse in her brother and sister; Early death in her father; Heart attack in her paternal uncle; Heart disease in her paternal grandfather. Social History:  reports that she has  never smoked. She has never used smokeless tobacco. She reports that she does not drink alcohol or use drugs.     Maternal Diabetes: No Genetic Screening: Normal Maternal Ultrasounds/Referrals: Normal Fetal Ultrasounds or other Referrals:  None Maternal Substance Abuse:  No Significant Maternal Medications:  None Significant Maternal Lab Results:  None Other Comments:  None  ROS History Dilation: 4.5 Effacement (%): 70 Station: -3 Exam by:: [employer], RN Blood pressure 131/64, pulse 84, temperature 98.3 F (36.8 C), temperature source Oral, resp. rate 18, height 5\' 4"  (1.626 m), weight 78.2 kg, last menstrual period 11/16/2017, SpO2 97 %. Exam Physical Exam  Prenatal labs: ABO, Rh: --/--/O NEG, O NEG Performed at [facility], [address], [place], [place] [address]  [phone]) Antibody: NEG (03/10 0213) Rubella:   RPR:    HBsAg:    HIV:    GBS:     Assessment/Plan: IUP at term PROM/SROM - Iv Abx for GBS Will augment with Pitocin as needed   [name] 08/15/2018, 7:41 AM

## 2018-08-15 NOTE — Anesthesia Procedure Notes (Signed)
Epidural Patient location during procedure: OB Start time: 08/15/2018 8:13 AM End time: 08/15/2018 8:32 AM  Staffing Anesthesiologist: Lowella Curb, MD Performed: anesthesiologist   Preanesthetic Checklist Completed: patient identified, site marked, surgical consent, pre-op evaluation, timeout performed, IV checked, risks and benefits discussed and monitors and equipment checked  Epidural Patient position: sitting Prep: ChloraPrep Patient monitoring: heart rate, cardiac monitor, continuous pulse ox and blood pressure Approach: midline Location: L2-L3 Injection technique: LOR saline  Needle:  Needle type: Tuohy  Needle gauge: 17 G Needle length: 9 cm Needle insertion depth: 5 cm Catheter type: closed end flexible Catheter size: 20 Guage Catheter at skin depth: 9 cm Test dose: negative  Assessment Events: blood not aspirated, injection not painful, no injection resistance, negative IV test and no paresthesia  Additional Notes Reason for block:procedure for pain

## 2018-08-15 NOTE — Anesthesia Pain Management Evaluation Note (Signed)
  CRNA Pain Management Visit Note  Patient: Natalie Brennan, 34 y.o., female  "Hello I am a member of the anesthesia team at [facility]. We have an anesthesia team available at all times to provide care throughout the hospital, including epidural management and anesthesia for C-section. I don't know your plan for the delivery whether it a natural birth, water birth, IV sedation, nitrous supplementation, doula or epidural, but we want to meet your pain goals."   1.Was your pain managed to your expectations on prior hospitalizations?   Yes   2.What is your expectation for pain management during this hospitalization?     Epidural  3.How can we help you reach that goal? Epidural when desired  Record the patient's initial score and the patient's pain goal.   Pain: 4  Pain Goal: undecided  [facility] wants you to be able to say your pain was always managed very well.  Gilad Dugger 08/15/2018

## 2018-08-15 NOTE — MAU Note (Signed)
Pt reports water broke at 11pm-clear fluid. Irregular contractions. Denies vaginal bleeding. Reports good fetal movement. Cervix 2cm on last exam.

## 2018-08-16 LAB — CBC
HCT: 32.6 % — ABNORMAL LOW (ref 36.0–46.0)
Hemoglobin: 10.9 g/dL — ABNORMAL LOW (ref 12.0–15.0)
MCH: 32.2 pg (ref 26.0–34.0)
MCHC: 33.4 g/dL (ref 30.0–36.0)
MCV: 96.4 fL (ref 80.0–100.0)
PLATELETS: 159 10*3/uL (ref 150–400)
RBC: 3.38 MIL/uL — ABNORMAL LOW (ref 3.87–5.11)
RDW: 13.8 % (ref 11.5–15.5)
WBC: 12.9 10*3/uL — AB (ref 4.0–10.5)
nRBC: 0 % (ref 0.0–0.2)

## 2018-08-16 MED ORDER — IBUPROFEN 600 MG PO TABS: 600.0000 mg | ORAL_TABLET | Freq: Four times a day (QID) | ORAL | 1 refills | Status: DC | PRN

## 2018-08-16 MED ORDER — ACETAMINOPHEN 325 MG PO TABS: 650.0000 mg | ORAL_TABLET | Freq: Four times a day (QID) | ORAL | 1 refills | Status: DC | PRN

## 2018-08-16 NOTE — Lactation Note (Signed)
This note was copied from a baby's chart. Lactation Consultation Note Baby 10 hrs old.  Mom states BF going well at this time. Mom's 4th child.. mom stated she BF her other 3 children 1 yr each. Mom's youngest child is 35 yrs old. Newborn behavior, STS, I&O, supply and demand reviewed. Mom resting in bed. Encouraged mom to call for assistance or questions. Lactation brochure left at bedside.  Patient Name: Natalie Brennan [id number] Date: 08/16/2018 Reason for consult: Initial assessment   Maternal Data Has patient been taught Hand Expression?: Yes Does the patient have breastfeeding experience prior to this delivery?: Yes  Feeding    LATCH Score                   Interventions    Lactation Tools Discussed/Used     Consult Status Consult Status: Follow-up Date: 08/17/18 Follow-up type: In-patient    Charyl Dancer 08/16/2018, 1:54 AM

## 2018-08-16 NOTE — Progress Notes (Signed)
Post Partum Day 1 Subjective: no complaints, up ad lib, voiding, tolerating PO and + flatus  Objective: Blood pressure 104/65, pulse 72, temperature 98 F (36.7 C), temperature source Oral, resp. rate 16, height 5\' 4"  (1.626 m), weight 78.2 kg, last menstrual period 11/16/2017, SpO2 99 %, unknown if currently breastfeeding.  Physical Exam:  General: alert, cooperative and no distress Lochia: appropriate Uterine Fundus: firm Incision: healing well DVT Evaluation: No evidence of DVT seen on physical exam.  Recent Labs    08/15/18 0213 08/16/18 0619  HGB 12.5 10.9*  HCT 36.4 32.6*    Assessment/Plan: Plan for discharge tomorrow   LOS: 1 day   Natalie Brennan II 08/16/2018, 7:39 AM

## 2018-08-16 NOTE — Discharge Summary (Signed)
Obstetric Discharge Summary Reason for Admission: onset of labor Prenatal Procedures: none Intrapartum Procedures: spontaneous vaginal delivery Postpartum Procedures: none Complications-Operative and Postpartum: none Hemoglobin  Date Value Ref Range Status  08/16/2018 10.9 (L) 12.0 - 15.0 g/dL Final   HCT  Date Value Ref Range Status  08/16/2018 32.6 (L) 36.0 - 46.0 % Final    Physical Exam:  General: alert, cooperative and no distress Lochia: appropriate Uterine Fundus: firm Incision: healing well DVT Evaluation: No evidence of DVT seen on physical exam.  Discharge Diagnoses: Term Pregnancy-delivered  Discharge Information: Date: 08/16/2018 Activity: pelvic rest Diet: routine Medications: PNV and Ibuprofen Condition: stable Instructions: refer to practice specific booklet Discharge to: home   Newborn Data: Live born female  Birth Weight: 7 lb 5.1 oz (3320 g) APGAR: 8, 9  Newborn Delivery   Birth date/time:  08/15/2018 15:44:00 Delivery type:  Vaginal, Spontaneous     Home with mother.  Roselle Locus II 08/16/2018, 7:53 AM

## 2018-08-16 NOTE — Progress Notes (Signed)
CSW received consult for hx of anxiety.  CSW met with MOB to offer support and complete assessment.    MOB breastfeeding infant with FOB resting on couch, upon CSW entering the room. CSW introduced self, role and reason for consult. MOB gave verbal permission to ask MOB any questions while FOB present in the room. CSW inquired about MOB's mental health history. MOB initially stated she did not have any mental health history, when asked about anxiety MOB stated she has some anxiety but that it is situational. MOB denied having any anxiety during her pregnancy and stated she is currently feeling "good". MOB reported being prescribed Xanax in the past but did not take during pregnancy. MOB stated she feelings comfortable reaching out to PCP in the event symptoms arise. MOB denied any current SI or HI.   CSW provided education regarding the baby blues period vs. perinatal mood disorders, discussed treatment and gave resources for mental health follow up if concerns arise.  CSW recommends self-evaluation during the postpartum time period using the New Mom Checklist from Postpartum Progress and encouraged MOB to contact a medical professional if symptoms are noted at any time.    CSW provided review of Sudden Infant Death Syndrome (SIDS) precautions.   CSW identifies no further need for intervention and no barriers to discharge at this time.  Ollen Barges, [place]  [facility] [employer] [phone]

## 2018-08-16 NOTE — Progress Notes (Signed)
Patient wants to go home this afternoon if baby doing OK-GBBS+

## 2018-08-17 MED ORDER — OXYCODONE-ACETAMINOPHEN 5-325 MG PO TABS: 1.0000 | ORAL_TABLET | ORAL | 0 refills | Status: DC | PRN

## 2018-08-17 NOTE — Discharge Summary (Signed)
Obstetric Discharge Summary Reason for Admission: onset of labor Prenatal Procedures: none Intrapartum Procedures: spontaneous vaginal delivery Postpartum Procedures: none Complications-Operative and Postpartum: none Hemoglobin  Date Value Ref Range Status  08/16/2018 10.9 (L) 12.0 - 15.0 g/dL Final   HCT  Date Value Ref Range Status  08/16/2018 32.6 (L) 36.0 - 46.0 % Final    Physical Exam:  General: alert, cooperative and appears stated age 33: appropriate Uterine Fundus: firm Incision: n/a DVT Evaluation: No evidence of DVT seen on physical exam. Negative Homan's sign. No cords or calf tenderness.  Discharge Diagnoses: Term Pregnancy-delivered  Discharge Information: Date: 08/17/2018 Activity: pelvic rest Diet: routine Medications: PNV, Ibuprofen and Percocet Condition: stable Instructions: refer to practice specific booklet Discharge to: home   Newborn Data: Live born female  Birth Weight: 7 lb 5.1 oz (3320 g) APGAR: 8, 9  Newborn Delivery   Birth date/time:  08/15/2018 15:44:00 Delivery type:  Vaginal, Spontaneous     Home with mother.  Mitchel Honour 08/17/2018, 10:24 AM

## 2018-08-17 NOTE — Lactation Note (Signed)
This note was copied from a baby's chart. Lactation Consultation Note  Patient Name: Girl Mikeshia Zotter [id number] Date: 08/17/2018 Reason for consult: Follow-up assessment;Early term 21-38.6wks  Visited with P3 Mom of ET infant on day of discharge.  Baby 44 hrs old and at 7% weight loss.  Mom denies any difficulty with latching baby.  Baby on the breast with a wide gape of her mouth.  Baby dressed and swaddled.  Recommended STS and alternate breast compression during feedings to increase milk transfer. Engorgement prevention and treatment reviewed.   Mom aware of OP lactation support available to her.  Encouraged to call prn.   Consult Status Consult Status: Complete Date: 08/17/18 Follow-up type: Call as needed    Judee Clara 08/17/2018, 11:49 AM

## 2018-10-19 DIAGNOSIS — Z01419 Encounter for gynecological examination (general) (routine) without abnormal findings: Secondary | ICD-10-CM | POA: Diagnosis not present

## 2018-10-19 DIAGNOSIS — N390 Urinary tract infection, site not specified: Secondary | ICD-10-CM | POA: Diagnosis not present

## 2018-11-10 ENCOUNTER — Emergency Department (HOSPITAL_COMMUNITY): Payer: Medicaid Other

## 2018-11-10 ENCOUNTER — Ambulatory Visit: Payer: Self-pay | Admitting: *Deleted

## 2018-11-10 ENCOUNTER — Other Ambulatory Visit: Payer: Self-pay

## 2018-11-10 ENCOUNTER — Encounter (HOSPITAL_COMMUNITY): Payer: Self-pay | Admitting: Emergency Medicine

## 2018-11-10 ENCOUNTER — Emergency Department (HOSPITAL_COMMUNITY)
Admission: EM | Admit: 2018-11-10 | Discharge: 2018-11-10 | Discharge status: Against Medical Advice | Payer: Medicaid Other | Attending: Emergency Medicine | Admitting: Emergency Medicine

## 2018-11-10 DIAGNOSIS — Z20828 Contact with and (suspected) exposure to other viral communicable diseases: Secondary | ICD-10-CM | POA: Insufficient documentation

## 2018-11-10 DIAGNOSIS — Z79899 Other long term (current) drug therapy: Secondary | ICD-10-CM | POA: Diagnosis not present

## 2018-11-10 DIAGNOSIS — M549 Dorsalgia, unspecified: Secondary | ICD-10-CM | POA: Diagnosis not present

## 2018-11-10 DIAGNOSIS — R1011 Right upper quadrant pain: Secondary | ICD-10-CM | POA: Diagnosis not present

## 2018-11-10 DIAGNOSIS — Z5320 Procedure and treatment not carried out because of patient's decision for unspecified reasons: Secondary | ICD-10-CM | POA: Diagnosis not present

## 2018-11-10 DIAGNOSIS — R509 Fever, unspecified: Secondary | ICD-10-CM | POA: Diagnosis not present

## 2018-11-10 DIAGNOSIS — R109 Unspecified abdominal pain: Secondary | ICD-10-CM

## 2018-11-10 LAB — URINALYSIS, ROUTINE W REFLEX MICROSCOPIC
Bacteria, UA: NONE SEEN
Bilirubin Urine: NEGATIVE
Glucose, UA: NEGATIVE mg/dL
Ketones, ur: NEGATIVE mg/dL
Leukocytes,Ua: NEGATIVE
Nitrite: NEGATIVE
Protein, ur: NEGATIVE mg/dL
Specific Gravity, Urine: 1.005 (ref 1.005–1.030)
pH: 6 (ref 5.0–8.0)

## 2018-11-10 LAB — CBC WITH DIFFERENTIAL/PLATELET
Abs Immature Granulocytes: 0.03 10*3/uL (ref 0.00–0.07)
Basophils Absolute: 0 10*3/uL (ref 0.0–0.1)
Basophils Relative: 0 %
Eosinophils Absolute: 0.1 10*3/uL (ref 0.0–0.5)
Eosinophils Relative: 1 %
HCT: 36.3 % (ref 36.0–46.0)
Hemoglobin: 12.5 g/dL (ref 12.0–15.0)
Immature Granulocytes: 0 %
Lymphocytes Relative: 18 %
Lymphs Abs: 1.6 10*3/uL (ref 0.7–4.0)
MCH: 31.2 pg (ref 26.0–34.0)
MCHC: 34.4 g/dL (ref 30.0–36.0)
MCV: 90.5 fL (ref 80.0–100.0)
Monocytes Absolute: 0.9 10*3/uL (ref 0.1–1.0)
Monocytes Relative: 10 %
Neutro Abs: 6.4 10*3/uL (ref 1.7–7.7)
Neutrophils Relative %: 71 %
Platelets: 191 10*3/uL (ref 150–400)
RBC: 4.01 MIL/uL (ref 3.87–5.11)
RDW: 12.1 % (ref 11.5–15.5)
WBC: 9 10*3/uL (ref 4.0–10.5)
nRBC: 0 % (ref 0.0–0.2)

## 2018-11-10 LAB — PREGNANCY, URINE: Preg Test, Ur: NEGATIVE

## 2018-11-10 LAB — COMPREHENSIVE METABOLIC PANEL
ALT: 69 U/L — ABNORMAL HIGH (ref 0–44)
AST: 45 U/L — ABNORMAL HIGH (ref 15–41)
Albumin: 3.9 g/dL (ref 3.5–5.0)
Alkaline Phosphatase: 68 U/L (ref 38–126)
Anion gap: 11 (ref 5–15)
BUN: 15 mg/dL (ref 6–20)
CO2: 24 mmol/L (ref 22–32)
Calcium: 9.5 mg/dL (ref 8.9–10.3)
Chloride: 103 mmol/L (ref 98–111)
Creatinine, Ser: 0.9 mg/dL (ref 0.44–1.00)
GFR calc Af Amer: >60 mL/min (ref >60)
GFR calc non Af Amer: >60 mL/min (ref >60)
Glucose, Bld: 95 mg/dL (ref 70–99)
Potassium: 3.8 mmol/L (ref 3.5–5.1)
Sodium: 138 mmol/L (ref 135–145)
Total Bilirubin: 1 mg/dL (ref 0.3–1.2)
Total Protein: 7.1 g/dL (ref 6.5–8.1)

## 2018-11-10 LAB — LIPASE, BLOOD: Lipase: 53 U/L — ABNORMAL HIGH (ref 11–51)

## 2018-11-10 MED ORDER — ACETAMINOPHEN 325 MG PO TABS
325.0000 mg | ORAL_TABLET | Freq: Once | ORAL | Status: AC
  Administered 2018-11-10: 16:00:00 325 mg via ORAL
  Filled 2018-11-10: qty 1

## 2018-11-10 NOTE — Telephone Encounter (Signed)
Spoke with patient regarding symptoms (listed below by triage nurse).  Patient denies urinary symptoms. Abdominal pain radiates to back, pain level of 3 out of 10, up to 6-7 when palpated.  Denies issues with bowels or change in diet.  Tenderness under left breast, denies red/hot areas on breast/nipple. Infant feeding well.  Patient also requesting a "rapid response" COVID 19 test. With patient c/o chills, fever, dizziness, abdominal and breast pain, advised patient to go to ER for evaluation. Patient has called ER to confirm she will be able to breastfeed her daughter since she is strictly breastfeeding, she was told they would accommodate her/daughter. Patient agreeable with plan, going to [facility] ER.

## 2018-11-10 NOTE — ED Triage Notes (Addendum)
Pt reports fevers, chills and generalized body aches x few days, temp 101.2 PTA. Wants to be screened for Covid

## 2018-11-10 NOTE — Telephone Encounter (Signed)
Pt called with complaints of chills, fever, dizziness, and headache that started on that started 11/09/2018; her highest temp was 102.2 on 11/09/2018; today her temp is 101.0 with tympanic thermometer (11/10/2018 at 1100 and 1400); she has taken motrin 600 mg tablets (1 tab last night, and 1 tab at 0730 and 1 tab at 1200 on 11/10/2018); the pt says that 2 weeks ago she had to take 2 rounds of antibiotics for UTI; she now has pain in her right side that radiates to her back; she rates the pain at 4 out of 10; when she pushes on the area it is 8 out of 10; she denies urinary frequency,urgency, and pain; the pt also says that she is nursing her 72.46 month old dtr, and her left breast is painful; she wonders if it could be the beginnings of mastitis; she states that breast is not red; the pt answers "no" to all questions on [facility]; she denies contact with anyone who has tested positive for COVID; recommendations made per nurse triage protocol; the pt does not want to go to the ED, and requests that the office be notified; she normally sees Dr Milinda Cave, [facility]; pt transferred to [facility] for final disposition; will route to office for notification.  Reason for Disposition . Patient sounds very sick or weak to the triager  (Exception: mild weakness and hasn't taken fever medicine)  Answer Assessment - Initial Assessment Questions 1. TEMPERATURE: "What is the most recent temperature?"  "How was it measured?"      101.0 tympanic thermometer 2. ONSET: "When did the fever start?"      11/09/2018 2215 3. SYMPTOMS: "Do you have any other symptoms besides the fever?"  (e.g., colds, headache, sore throat, earache, cough, rash, diarrhea, vomiting, abdominal pain)    Chills  On 11/09/2018, headache, dizziness; side and back pain; breast pain, 4. CAUSE: If there are no symptoms, ask: "What do you think is causing the fever?"      Not sure (uti, mastistis) 5. CONTACTS: "Does anyone else in the family have an infection?"    no 6. TREATMENT: "What have you done so far to treat this fever?" (e.g., medications)     Ibuprofen 600 mg x 3 doses 7. IMMUNOCOMPROMISE: "Do you have of the following: diabetes, HIV positive, splenectomy, cancer chemotherapy, chronic steroid treatment, transplant patient, etc."     no 8. PREGNANCY: "Is there any chance you are pregnant?" "When was your last menstrual period?"     Yes, pt is breast feeding 9. TRAVEL: "Have you traveled out of the country in the last month?" (e.g., travel history, exposures)  no  Protocols used: FEVER-A-AH

## 2018-11-10 NOTE — ED Notes (Signed)
The pt had to leave  She left before she had the results of her [place]  She signed out ama

## 2018-11-10 NOTE — ED Notes (Signed)
The pt is c/o abd pain  With chills and fever since yesterday  Recent uti with 2 rounds of antibiotics.  Last advil was  At 1200 n today

## 2018-11-10 NOTE — Telephone Encounter (Signed)
Noted/Agree Signed:  Santiago Bumpers, MD           11/10/2018

## 2018-11-10 NOTE — ED Provider Notes (Signed)
[facility] Provider Note   CSN: [id number] Arrival date & time: 11/10/18  1456    History   Chief Complaint Chief Complaint  Patient presents with  . Fever  . Chills  . Generalized Body Aches    HPI Natalie Brennan is a 33 y.o. female.     Patient is a 33 year old female with past medical history of anxiety and kidney stones, UTI who presents the emergency department for fever and abdominal pain.  Patient reports that this began yesterday.  Reports that she has a dull ache in her right upper quadrant which radiates into her back. No cough or other URI symptoms. Reports feeling feverish.  Denies any nausea, vomiting, diarrhea, chest pain, shortness of breath.  Reports that she has been very strict throughout the whole week NT Not exposed herself to others.  She does have a newborn baby at home.     Past Medical History:  Diagnosis Date  . Anxiety   . Chronic constipation   . Endometriosis   . Frequent headaches   . History of fatigue   . History of gastritis 2018  . History of gestational diabetes   . History of metrorrhagia   . IBS (irritable bowel syndrome)   . Interstitial cystitis    Urologist dx in remote past--pt questions the accuracy of this.  . 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 Raynaud's disease   . Recurrent UTI    About 4 per year  . Scoliosis     Patient Active Problem List   Diagnosis Date Noted  . Indication for care in labor or delivery 08/15/2018  . Endometriosis 09/04/2012    Class: Diagnosis of    Past Surgical History:  Procedure Laterality Date  . DILATATION & CURRETTAGE/HYSTEROSCOPY WITH RESECTOCOPE N/A 09/04/2012   Procedure: DILATATION & CURETTAGE/HYSTEROSCOPY WITH RESECTOCOPE;  Surgeon: Juluis MireJohn S McComb, MD;  Location: [facility];  Service: Gynecology;  Laterality: N/A;  . DOPPLER ECHOCARDIOGRAPHY  04/20/2012   EF 60-65%--All normal (Dr. Anne FuSkains).  . EYE SURGERY     Lasik  -bilateral eyes  . LAPAROSCOPY N/A 09/04/2012   Procedure: LAPAROSCOPY OPERATIVE;  Surgeon: Juluis MireJohn S McComb, MD;  Location: [facility];  Service: Gynecology;  Laterality: N/A;  . WISDOM TOOTH EXTRACTION       OB History    Gravida  4   Para  3   Term  2   Preterm  1   AB  1   Living  3     SAB  1   TAB      Ectopic      Multiple  0   Live Births  3            Home Medications    Prior to Admission medications   Medication Sig Start Date End Date Taking? Authorizing Provider  acetaminophen (TYLENOL) 325 MG tablet Take 2 tablets (650 mg total) by mouth every 6 (six) hours as needed (for pain scale < 4). 08/16/18   Harold Hedgeomblin, James, MD  ibuprofen (ADVIL,MOTRIN) 600 MG tablet Take 1 tablet (600 mg total) by mouth every 6 (six) hours as needed. 08/16/18   Harold Hedgeomblin, James, MD  omeprazole (PRILOSEC) 20 MG capsule Take 20 mg by mouth daily.    [provider]  oxyCODONE-acetaminophen (PERCOCET/ROXICET) 5-325 MG tablet Take 1 tablet by mouth every 4 (four) hours as needed (pain scale 4-7). 08/17/18   Mitchel HonourMorris, Megan, DO  Prenatal  Vit-Fe Fumarate-FA (PRENATAL MULTIVITAMIN) TABS tablet Take 1 tablet by mouth at bedtime.    [provider]  vitamin C (ASCORBIC ACID) 500 MG tablet Take 500 mg by mouth at bedtime.    [provider]    Family History Family History  Problem Relation Age of Onset  . Depression Mother   . Alcohol abuse Father   . Early death Father   . Cirrhosis Father   . Drug abuse Sister   . Drug abuse Brother   . Asthma Daughter   . Heart disease Paternal Grandfather   . Cancer Maternal Grandmother        Ovary/uterus  . Diabetes Maternal Grandmother   . Diabetes Paternal Grandmother   . Heart attack Paternal Uncle     Social History Social History   Tobacco Use  . Smoking status: Never Smoker  . Smokeless tobacco: Never Used  Substance Use Topics  . Alcohol use: No  . Drug use: No     Allergies   Patient has no known  allergies.   Review of Systems Review of Systems  Constitutional: Positive for appetite change and fever. Negative for chills, diaphoresis and fatigue.  Respiratory: Negative for cough and shortness of breath.   Cardiovascular: Negative for chest pain.  Gastrointestinal: Positive for abdominal pain. Negative for blood in stool, diarrhea, nausea and vomiting.  Genitourinary: Negative for dysuria, frequency, hematuria and pelvic pain.  Musculoskeletal: Negative for back pain and myalgias.  Skin: Negative for rash.  Allergic/Immunologic: Negative for immunocompromised state.  Neurological: Negative for dizziness and light-headedness.     Physical Exam Updated Vital Signs BP 99/60   Pulse 93   Temp (!) 102.1 F (38.9 C) (Oral)   Resp 17   Wt 78.2 kg   SpO2 99%   BMI 29.59 kg/m   Physical Exam Vitals signs and nursing note reviewed.  Constitutional:      General: She is not in acute distress.    Appearance: Normal appearance. She is not ill-appearing, toxic-appearing or diaphoretic.  HENT:     Head: Normocephalic.  Eyes:     Conjunctiva/sclera: Conjunctivae normal.  Pulmonary:     Effort: Pulmonary effort is normal.  Abdominal:     General: Abdomen is flat. Bowel sounds are normal.     Tenderness: There is abdominal tenderness in the right upper quadrant. There is no right CVA tenderness or left CVA tenderness.  Skin:    General: Skin is dry.  Neurological:     Mental Status: She is alert.  Psychiatric:        Mood and Affect: Mood normal.      ED Treatments / Results  Labs (all labs ordered are listed, but only abnormal results are displayed) Labs Reviewed  COMPREHENSIVE METABOLIC PANEL - Abnormal; Notable for the following components:      Result Value   AST 45 (*)    ALT 69 (*)    All other components within normal limits  LIPASE, BLOOD - Abnormal; Notable for the following components:   Lipase 53 (*)    All other components within normal limits   URINALYSIS, ROUTINE W REFLEX MICROSCOPIC - Abnormal; Notable for the following components:   Color, Urine STRAW (*)    Hgb urine dipstick SMALL (*)    All other components within normal limits  NOVEL CORONAVIRUS, NAA (HOSPITAL ORDER, SEND-OUT TO REF LAB)  CBC WITH DIFFERENTIAL/PLATELET  PREGNANCY, URINE    EKG None  Radiology US Abdomen Limited Ruq  Result Date: 11/10/2018 CLINICAL DATA:  Right upper quadrant pain for the past day. EXAM: ULTRASOUND ABDOMEN LIMITED RIGHT UPPER QUADRANT COMPARISON:  Abdominal ultrasound dated January 13, 2017. FINDINGS: Gallbladder: No gallstones or wall thickening visualized. No sonographic Murphy sign noted by sonographer. Common bile duct: Diameter: 3 mm, normal. Liver: No focal lesion identified. Within normal limits in parenchymal echogenicity. Portal vein is patent on color Doppler imaging with normal direction of blood flow towards the liver. IMPRESSION: Normal right upper quadrant ultrasound. Electronically Signed   By: Obie Dredge M.D.   On: 11/10/2018 17:59    Procedures Procedures (including critical care time)  Medications Ordered in ED Medications  acetaminophen (TYLENOL) tablet 325 mg (325 mg Oral Given 11/10/18 1625)     Initial Impression / Assessment and Plan / ED Course  I have reviewed the triage vital signs and the nursing notes.  Pertinent labs & imaging results that were available during my care of the patient were reviewed by me and considered in my medical decision making (see chart for details).  Clinical Course as of Nov 10 1818  Fri Nov 10, 2018  1645 Patient with 1 days of abdominal pain and fever. Temp 102.1 in triage. Pain is minimal and in the RUQ. Will work patient up for fever including labs, UDS, RUQ [place] and covid testing.   [KM]  1646 Natalie Brennan was evaluated in Emergency Department on 11/10/2018 for the symptoms described in the history of present illness. She was evaluated in the context of the global COVID-19  pandemic, which necessitated consideration that the patient might be at risk for infection with the SARS-CoV-2 virus that causes COVID-19. Institutional protocols and algorithms that pertain to the evaluation of patients at risk for COVID-19 are in a state of rapid change based on information released by regulatory bodies including the CDC and federal and state organizations. These policies and algorithms were followed during the patient's care in the ED.     [KM]  1752 Patient decided she needed to leave to be home with her newborn and could not stay any longer.  I discussed lab work findings with the patient.  She does have mild elevation in her AST and ALT, 45 and 69 respectively.  Otherwise her lab work is very unremarkable and her urinalysis is normal.  This LFT elevation could be due to a number of things which I discussed with the patient.  It is still possible that she could have COVID-19.  It is possible she may have a gallbladder pathology which would show up on ultrasound.  It is also possible she may have some other viral infection causing mild hepatitis.  I advised the patient that she should take every precaution to avoid contact with her family especially her newborn as this could be fatal to a newborn if this is COVID.  I advised the patient that if her ultrasound is normal and her COVID test is negative she should still quarantine herself as though she has, but especially in the setting of having a different home.  I advised the patient that she should stay for her ultrasound readings as if she has an emergent process like cholecystitis she would need to be admitted.  Patient understands the risk of leaving.  Patient reports that she will follow-up with her results on MyChart.  Additionally, I will advised her of her ultrasound results over the phone if there is any emergent process. I again advised the patient to stay for her results  but she declined and ultimately signed out AMA.   [KM]   1818 I called patient and tried to notify her that her [place] was normal, received no answer   [KM]    Clinical Course User Index [KM] Arlyn Dunning, PA-C         Final Clinical Impressions(s) / ED Diagnoses   Final diagnoses:  Abdominal pain    ED Discharge Orders    None       Jeral Pinch 11/10/18 1821    Azalia Bilis, MD 11/10/18 [phone]

## 2018-11-11 LAB — NOVEL CORONAVIRUS, NAA (HOSP ORDER, SEND-OUT TO REF LAB; TAT 18-24 HRS): SARS-CoV-2, NAA: NOT DETECTED

## 2018-11-13 DIAGNOSIS — R5381 Other malaise: Secondary | ICD-10-CM | POA: Diagnosis not present

## 2018-11-13 DIAGNOSIS — R509 Fever, unspecified: Secondary | ICD-10-CM | POA: Diagnosis not present

## 2018-11-13 DIAGNOSIS — R42 Dizziness and giddiness: Secondary | ICD-10-CM | POA: Diagnosis not present

## 2018-12-01 ENCOUNTER — Ambulatory Visit (INDEPENDENT_AMBULATORY_CARE_PROVIDER_SITE_OTHER): Payer: Medicaid Other | Admitting: Family Medicine

## 2018-12-01 ENCOUNTER — Other Ambulatory Visit: Payer: Self-pay

## 2018-12-01 ENCOUNTER — Encounter: Payer: Self-pay | Admitting: Family Medicine

## 2018-12-01 VITALS — Wt 147.0 lb

## 2018-12-01 DIAGNOSIS — F419 Anxiety disorder, unspecified: Secondary | ICD-10-CM

## 2018-12-01 MED ORDER — PROPRANOLOL HCL 20 MG PO TABS: ORAL_TABLET | ORAL | 1 refills | Status: DC

## 2018-12-01 NOTE — Progress Notes (Signed)
Virtual Visit via Video Note  I connected with pt on 12/01/18 at  4:15 PM EDT by a video enabled telemedicine application and verified that I am speaking with the correct person using two identifiers.  Location patient: home Location provider:work or home office Persons participating in the virtual visit: patient, provider  I discussed the limitations of evaluation and management by telemedicine and the availability of in person appointments. The patient expressed understanding and agreed to proceed.  Telemedicine visit is a necessity given the COVID-19 restrictions in place at the current time.  HPI: 33 y/o WF being seen today for f/u of anxiety. High anxiety lately due to covid 19 situation.  Emotions are high. She has a young baby, 543 mo old female.  She is breast feeding. Not taking any anxiety med lately. No panic.   Denies depressed mood. She has had success with alprazolam use prn in the past, most recent rx I wrote for her was  10/14/17, 0.5mg , #60, no RF.  PMP aware reviewed.  No suspicious activity.  Pt is more in favor of a med to use on prn basis rather than an antidepressant to take daily.  She has not felt very well on antidepressants in the past.   ROS: See pertinent positives and negatives per HPI.  Past Medical History:  Diagnosis Date  . Anxiety   . Chronic constipation   . Endometriosis   . Frequent headaches   . History of fatigue   . History of gastritis 2018  . History of gestational diabetes   . History of metrorrhagia   . IBS (irritable bowel syndrome)   . Interstitial cystitis    Urologist dx in remote past--pt questions the accuracy of this.  . 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 Raynaud's disease   . Recurrent UTI    About 4 per year  . Scoliosis     Past Surgical History:  Procedure Laterality Date  . DILATATION & CURRETTAGE/HYSTEROSCOPY WITH RESECTOCOPE N/A 09/04/2012   Procedure: DILATATION &  CURETTAGE/HYSTEROSCOPY WITH RESECTOCOPE;  Surgeon: Juluis MireJohn S McComb, MD;  Location: [facility];  Service: Gynecology;  Laterality: N/A;  . DOPPLER ECHOCARDIOGRAPHY  04/20/2012   EF 60-65%--All normal (Dr. Anne FuSkains).  . EYE SURGERY     Lasik -bilateral eyes  . LAPAROSCOPY N/A 09/04/2012   Procedure: LAPAROSCOPY OPERATIVE;  Surgeon: Juluis MireJohn S McComb, MD;  Location: [facility];  Service: Gynecology;  Laterality: N/A;  . WISDOM TOOTH EXTRACTION      Family History  Problem Relation Age of Onset  . Depression Mother   . Alcohol abuse Father   . Early death Father   . Cirrhosis Father   . Drug abuse Sister   . Drug abuse Brother   . Asthma Daughter   . Heart disease Paternal Grandfather   . Cancer Maternal Grandmother        Ovary/uterus  . Diabetes Maternal Grandmother   . Diabetes Paternal Grandmother   . Heart attack Paternal Uncle   :  Social History   Socioeconomic History  . Marital status: Married    Spouse name: Not on file  . Number of children: Not on file  . Years of education: Not on file  . Highest education level: Not on file  Occupational History  . Not on file  Social Needs  . Financial resource strain: Not on file  . Food insecurity    Worry: Not on file    Inability: Not  on file  . Transportation needs    Medical: Not on file    Non-medical: Not on file  Tobacco Use  . Smoking status: Never Smoker  . Smokeless tobacco: Never Used  Substance and Sexual Activity  . Alcohol use: No  . Drug use: No  . Sexual activity: Yes    Birth control/protection: None    Comment: husband has had a vasectomy  Lifestyle  . Physical activity    Days per week: Not on file    Minutes per session: Not on file  . Stress: Not on file  Relationships  . Social Herbalist on phone: Not on file    Gets together: Not on file    Attends religious service: Not on file    Active member of club or organization: Not on file    Attends meetings of clubs or organizations: Not on file     Relationship status: Not on file  Other Topics Concern  . Not on file  Social History Narrative   Married, 1 son and 1 daughter.   Educ: associates degree   Occup: homemaker   No T/A/Ds.      Current Outpatient Medications:  .  ibuprofen (ADVIL,MOTRIN) 600 MG tablet, Take 1 tablet (600 mg total) by mouth every 6 (six) hours as needed., Disp: 60 tablet, Rfl: 1 .  Prenatal Vit-Fe Fumarate-FA (PRENATAL MULTIVITAMIN) TABS tablet, Take 1 tablet by mouth at bedtime., Disp: , Rfl:  .  vitamin C (ASCORBIC ACID) 500 MG tablet, Take 500 mg by mouth at bedtime., Disp: , Rfl:   EXAM:  VITALS per patient if applicable:  GENERAL: alert, oriented, appears well and in no acute distress  HEENT: atraumatic, conjunttiva clear, no obvious abnormalities on inspection of external nose and ears  NECK: normal movements of the head and neck  LUNGS: on inspection no signs of respiratory distress, breathing rate appears normal, no obvious gross SOB, gasping or wheezing  CV: no obvious cyanosis  MS: moves all visible extremities without noticeable abnormality  PSYCH/NEURO: pleasant and cooperative, no obvious depression or anxiety, speech and thought processing grossly intact  ASSESSMENT AND PLAN:  Discussed the following assessment and plan:  Acute on chronic anxiety in a breastfeeding mother: cannot give benzos b/c they are contraindicated in breastfeeding women.  Considered hydroxyzine but this is contraindicated as well. Propranolol is generally accepted as safe in breastfeeding women so I rx'd propranolol 20mg , 1-2 po bid prn, #60, RF x 1. She'll call if this is not helpful and she said she would probably be willing to try antidepressant at that time b/c no other prn alternative.  I discussed the assessment and treatment plan with the patient. The patient was provided an opportunity to ask questions and all were answered. The patient agreed with the plan and demonstrated an understanding of  the instructions.   The patient was advised to call back or seek an in-person evaluation if the symptoms worsen or if the condition fails to improve as anticipated.  F/u: prn  Signed:  Crissie Sickles, MD           12/01/2018

## 2018-12-08 ENCOUNTER — Other Ambulatory Visit: Payer: Self-pay | Admitting: Family Medicine

## 2019-01-13 ENCOUNTER — Other Ambulatory Visit: Payer: Self-pay | Admitting: Family Medicine

## 2019-03-26 ENCOUNTER — Other Ambulatory Visit: Payer: Self-pay

## 2019-03-26 ENCOUNTER — Ambulatory Visit: Payer: Medicaid Other | Admitting: Family Medicine

## 2019-03-28 ENCOUNTER — Encounter: Payer: Self-pay | Admitting: Family Medicine

## 2019-03-28 ENCOUNTER — Ambulatory Visit (INDEPENDENT_AMBULATORY_CARE_PROVIDER_SITE_OTHER): Payer: Medicaid Other | Admitting: Family Medicine

## 2019-03-28 ENCOUNTER — Other Ambulatory Visit: Payer: Self-pay

## 2019-03-28 DIAGNOSIS — F4322 Adjustment disorder with anxiety: Secondary | ICD-10-CM | POA: Diagnosis not present

## 2019-03-28 DIAGNOSIS — F411 Generalized anxiety disorder: Secondary | ICD-10-CM

## 2019-03-28 MED ORDER — SERTRALINE HCL 50 MG PO TABS: 50.0000 mg | ORAL_TABLET | Freq: Every day | ORAL | 0 refills | Status: DC

## 2019-03-28 NOTE — Progress Notes (Signed)
Virtual Visit via Video Note  I connected with Natalie Brennan on 03/28/19 at  4:00 PM EDT by a video enabled telemedicine application and verified that I am speaking with the correct person using two identifiers.  Location patient: home Location provider:work or home office Persons participating in the virtual visit: patient, provider  I discussed the limitations of evaluation and management by telemedicine and the availability of in person appointments. The patient expressed understanding and agreed to proceed.  Telemedicine visit is a necessity given the COVID-19 restrictions in place at the current time.  HPI: 33 y/o WF being seen today to discuss anxiety. A/P as of 11/2018-->"Acute on chronic anxiety in a breastfeeding mother: cannot give benzos b/c they are contraindicated in breastfeeding women.  Considered hydroxyzine but this is contraindicated as well. Propranolol is generally accepted as safe in breastfeeding women so I rx'd propranolol 20mg , 1-2 po bid prn, #60, RF x 1. She'll call if this is not helpful and she said she would probably be willing to try antidepressant at that time b/c no other prn alternative."  Interim hx: Still with chronic worry about everything, has her irritable and wound up all the time. Intermittent feeling dizziness as well as some IBS sx's that come with increased anxiety, but no distinct panic attacks.  Sleep is impaired.  No depression.  Appetite good, energy level good. She worries the most about covid 19 and the changes it has brought to life, worries about her 36 mo old infant. She does continue to breast feed. The propranolol did not help and made her feel awkward, caused fatigue so she stopped it after only taking it 1-2 times.  ROS: no CP, no SOB, no wheezing, no cough, no HAs, no rashes, no melena/hematochezia.  No polyuria or polydipsia.  No myalgias or arthralgias.   Past Medical History:  Diagnosis Date  . Anxiety   . Chronic constipation   .  Endometriosis   . Frequent headaches   . History of fatigue   . History of gastritis 2018  . History of gestational diabetes   . History of metrorrhagia   . IBS (irritable bowel syndrome)   . Interstitial cystitis    Urologist dx in remote past--Natalie Brennan questions the accuracy of this.  . 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 Raynaud's disease   . Recurrent UTI    About 4 per year  . Scoliosis     Past Surgical History:  Procedure Laterality Date  . DILATATION & CURRETTAGE/HYSTEROSCOPY WITH RESECTOCOPE N/A 09/04/2012   Procedure: DILATATION & CURETTAGE/HYSTEROSCOPY WITH RESECTOCOPE;  Surgeon: 09/06/2012, MD;  Location: [facility];  Service: Gynecology;  Laterality: N/A;  . DOPPLER ECHOCARDIOGRAPHY  04/20/2012   EF 60-65%--All normal (Dr. 04/22/2012).  . EYE SURGERY     Lasik -bilateral eyes  . LAPAROSCOPY N/A 09/04/2012   Procedure: LAPAROSCOPY OPERATIVE;  Surgeon: 09/06/2012, MD;  Location: [facility];  Service: Gynecology;  Laterality: N/A;  . WISDOM TOOTH EXTRACTION      Family History  Problem Relation Age of Onset  . Depression Mother   . Alcohol abuse Father   . Early death Father   . Cirrhosis Father   . Drug abuse Sister   . Drug abuse Brother   . Asthma Daughter   . Heart disease Paternal Grandfather   . Cancer Maternal Grandmother        Ovary/uterus  . Diabetes Maternal Grandmother   . Diabetes Paternal Grandmother   .  Heart attack Paternal Uncle     SOCIAL HX:  Social History   Socioeconomic History  . Marital status: Married    Spouse name: Not on file  . Number of children: Not on file  . Years of education: Not on file  . Highest education level: Not on file  Occupational History  . Not on file  Social Needs  . Financial resource strain: Not on file  . Food insecurity    Worry: Not on file    Inability: Not on file  . Transportation needs    Medical: Not on file    Non-medical: Not on file  Tobacco Use  . Smoking status:  Never Smoker  . Smokeless tobacco: Never Used  Substance and Sexual Activity  . Alcohol use: No  . Drug use: No  . Sexual activity: Yes    Birth control/protection: None    Comment: husband has had a vasectomy  Lifestyle  . Physical activity    Days per week: Not on file    Minutes per session: Not on file  . Stress: Not on file  Relationships  . Social Herbalist on phone: Not on file    Gets together: Not on file    Attends religious service: Not on file    Active member of club or organization: Not on file    Attends meetings of clubs or organizations: Not on file    Relationship status: Not on file  Other Topics Concern  . Not on file  Social History Narrative   Married, 1 son and 1 daughter.   Educ: associates degree   Occup: homemaker   No T/A/Ds.      Current Outpatient Medications:  .  ibuprofen (ADVIL,MOTRIN) 600 MG tablet, Take 1 tablet (600 mg total) by mouth every 6 (six) hours as needed., Disp: 60 tablet, Rfl: 1 .  Prenatal Vit-Fe Fumarate-FA (PRENATAL MULTIVITAMIN) TABS tablet, Take 1 tablet by mouth at bedtime., Disp: , Rfl:  .  vitamin C (ASCORBIC ACID) 500 MG tablet, Take 500 mg by mouth at bedtime., Disp: , Rfl:   EXAM:  VITALS per patient if applicable: There were no vitals taken for this visit.   GENERAL: alert, oriented, appears well and in no acute distress  HEENT: atraumatic, conjunttiva clear, no obvious abnormalities on inspection of external nose and ears  NECK: normal movements of the head and neck  LUNGS: on inspection no signs of respiratory distress, breathing rate appears normal, no obvious gross SOB, gasping or wheezing  CV: no obvious cyanosis  MS: moves all visible extremities without noticeable abnormality  PSYCH/NEURO: pleasant and cooperative, no obvious depression or anxiety, speech and thought processing grossly intact  LABS: none today    Chemistry      Component Value Date/Time   NA 138 11/10/2018 1605    NA 140 07/09/2016   K 3.8 11/10/2018 1605   CL 103 11/10/2018 1605   CO2 24 11/10/2018 1605   BUN 15 11/10/2018 1605   BUN 16 07/09/2016   CREATININE 0.90 11/10/2018 1605   GLU 91 07/09/2016      Component Value Date/Time   CALCIUM 9.5 11/10/2018 1605   ALKPHOS 68 11/10/2018 1605   AST 45 (H) 11/10/2018 1605   ALT 69 (H) 11/10/2018 1605   BILITOT 1.0 11/10/2018 1605     Lab Results  Component Value Date   WBC 9.0 11/10/2018   HGB 12.5 11/10/2018   HCT 36.3 11/10/2018  MCV 90.5 11/10/2018   PLT 191 11/10/2018   Lab Results  Component Value Date   TSH 2.35 07/09/2016    ASSESSMENT AND PLAN:  Discussed the following assessment and plan:  GAD, superimposed adjustment d/o with anxiety due to covid 19 crisis and it's associated restrictions on life. Breast feeding, so cannot use benzos and many antidepressants. Will do trial of sertraline 50 mg qd. Therapeutic expectations and side effect profile of medication discussed today.  Patient's questions answered.  I discussed the assessment and treatment plan with the patient. The patient was provided an opportunity to ask questions and all were answered. The patient agreed with the plan and demonstrated an understanding of the instructions.   The patient was advised to call back or seek an in-person evaluation if the symptoms worsen or if the condition fails to improve as anticipated.  F/u: 1 mo telemed f/u anxiety  Signed:  Santiago BumpersPhil Tenicia Gural, MD           03/28/2019

## 2019-04-22 ENCOUNTER — Other Ambulatory Visit: Payer: Self-pay | Admitting: Family Medicine

## 2019-04-25 IMAGING — US US ABDOMEN COMPLETE
1 series · 14 of 25 positions shown · non-contrast
Comparison: None.

CLINICAL DATA: Epigastric abdominal pain and nausea for 1 month.

EXAM:
ABDOMEN ULTRASOUND COMPLETE

[Series 1: us abdomen complete · 0.15mm/px · 14 of 76 slices shown]
[im 1/76]
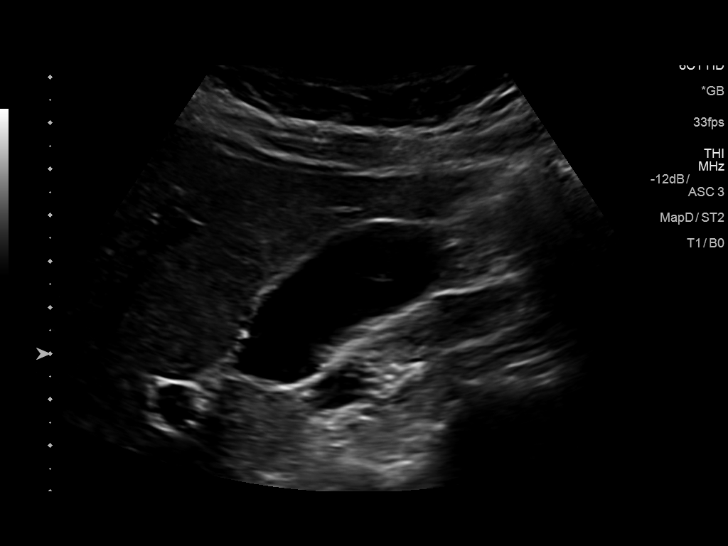
[im 7/76]
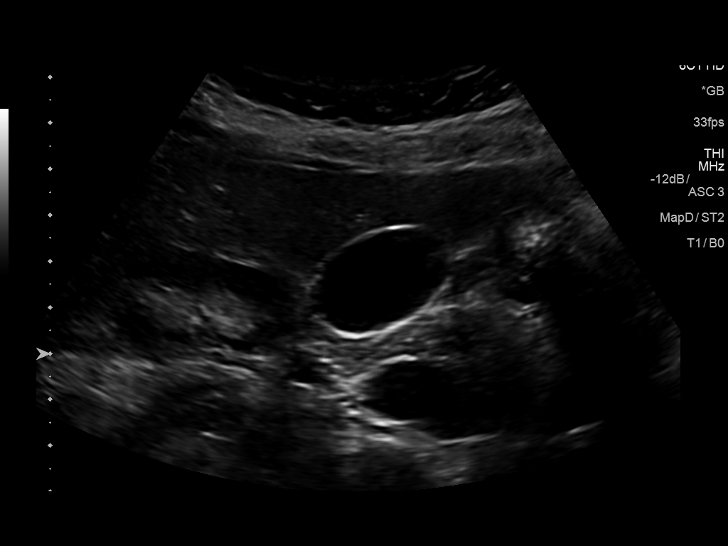
[im 13/76]
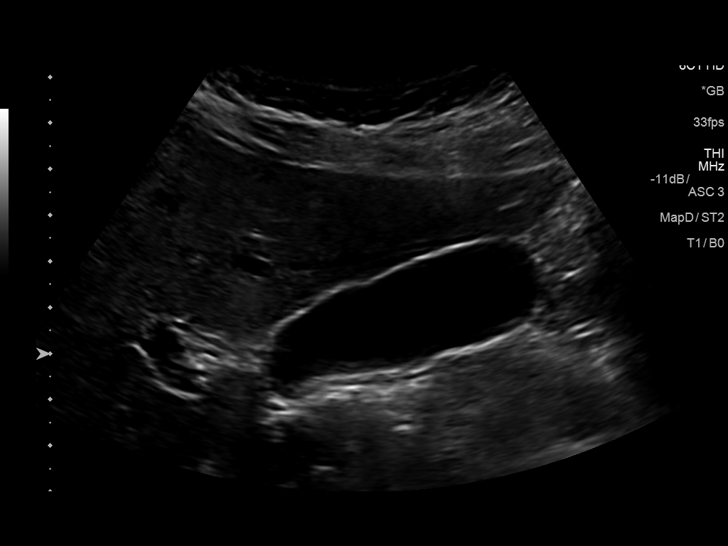
[im 19/76]
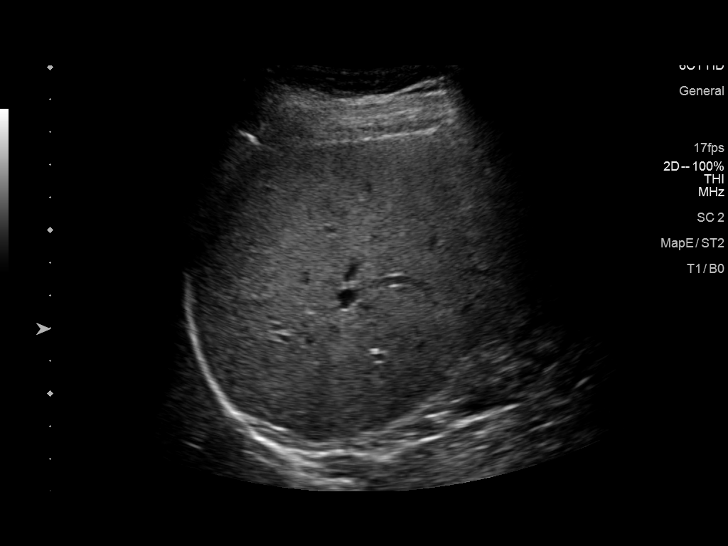
[im 26/76]
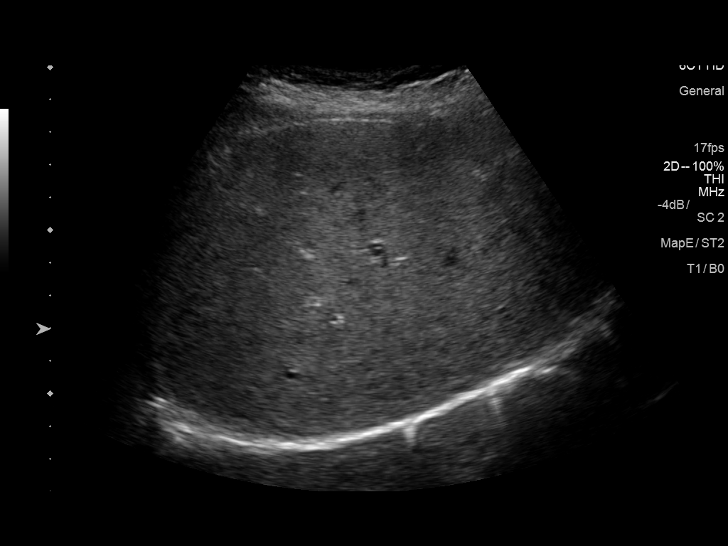
[im 29/76]
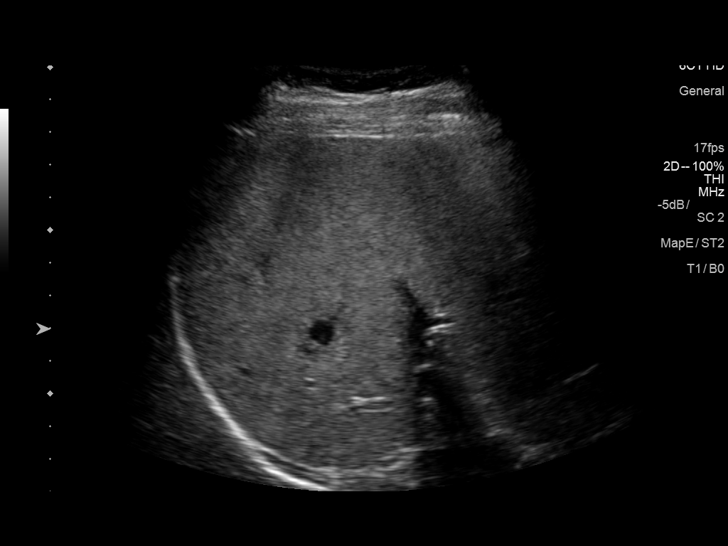
[im 35/76]
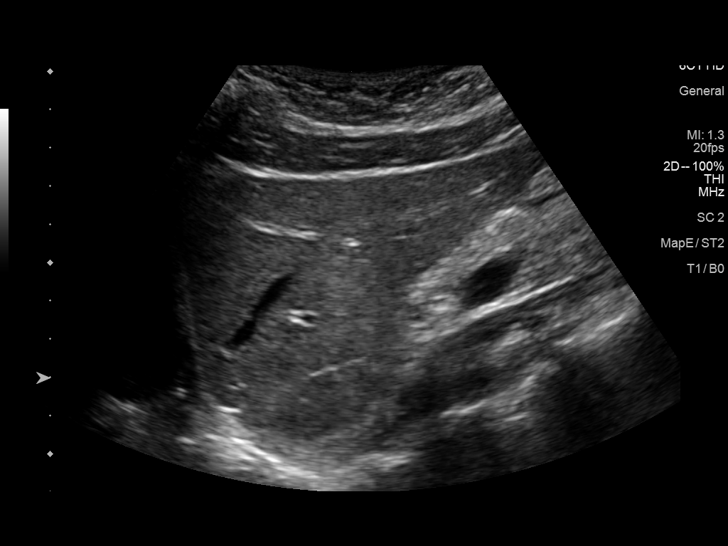
[im 41/76]
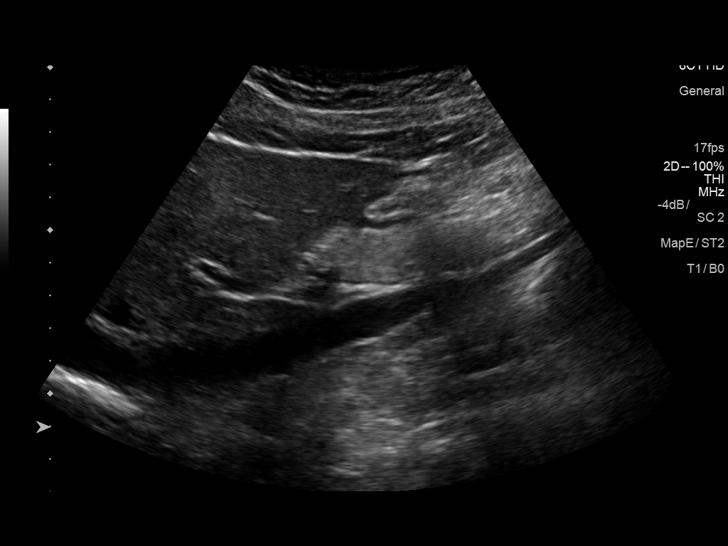
[im 47/76]
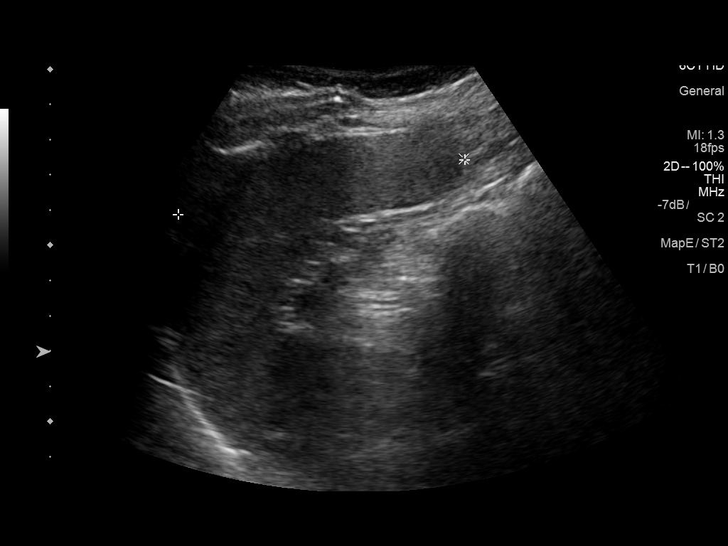
[im 51/76]
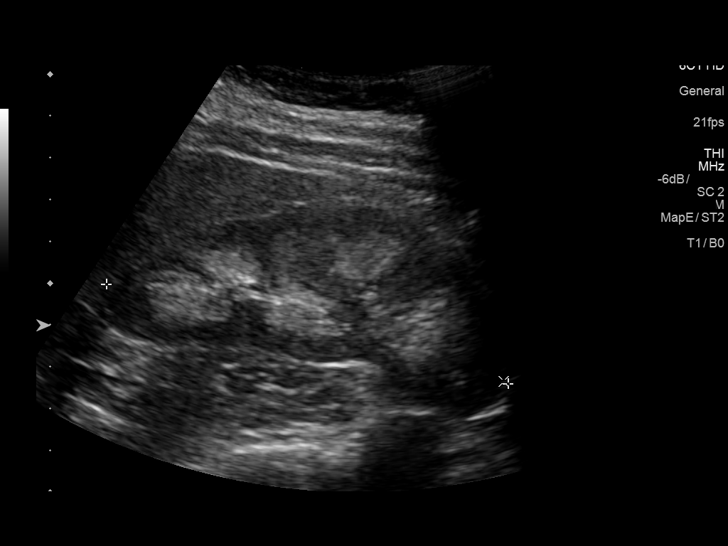
[im 57/76]
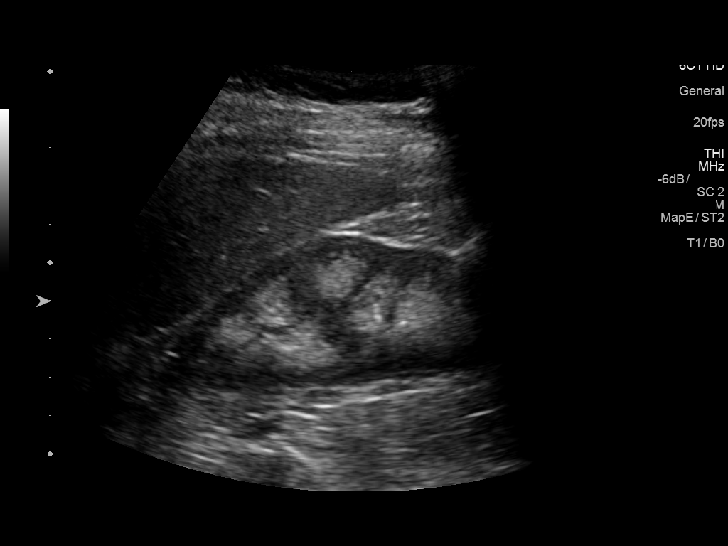
[im 63/76]
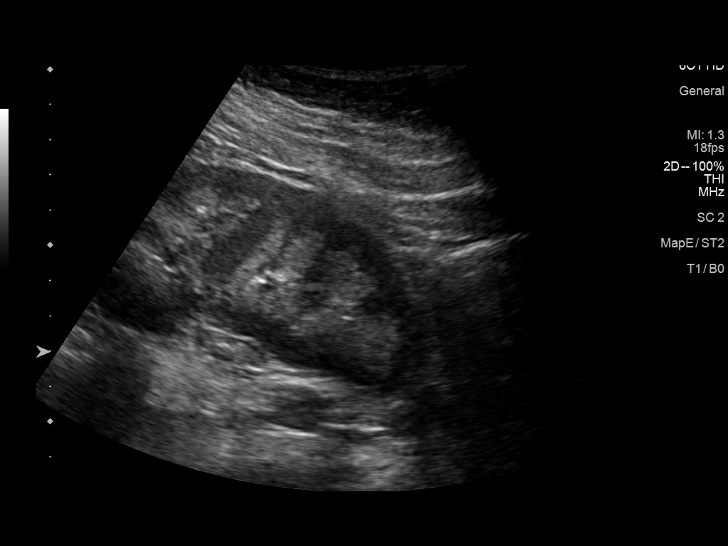
[im 69/76]
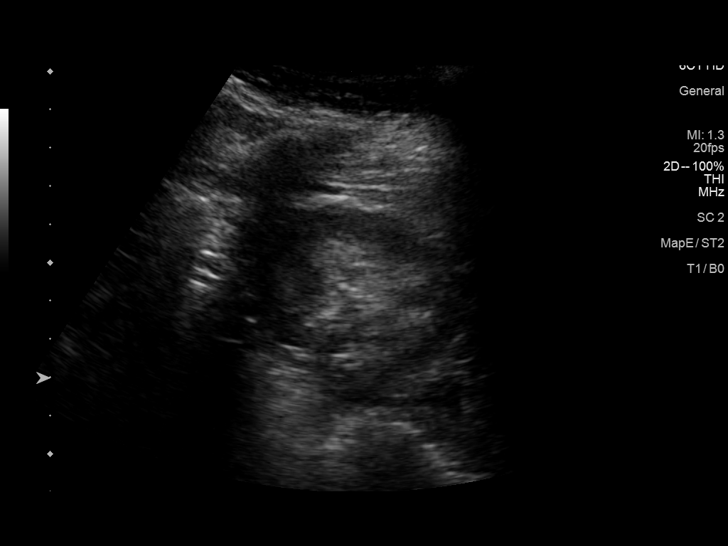
[im 76/76]
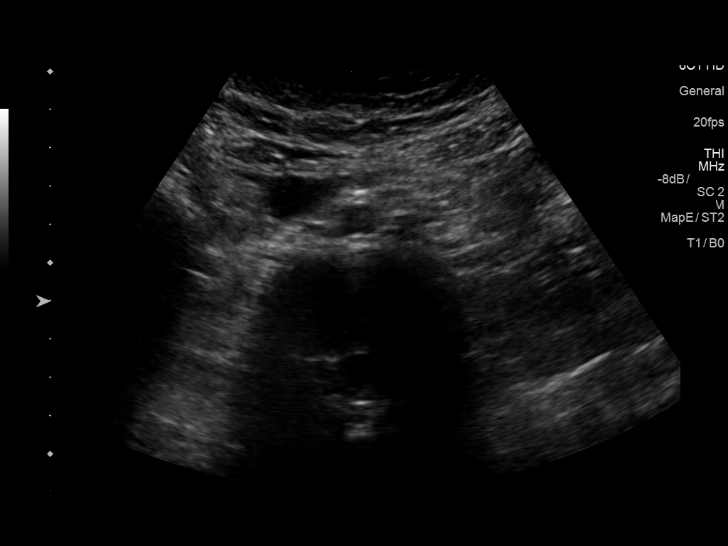

[14 of 25 positions shown; findings below may reference images not displayed]

FINDINGS: Gallbladder: No gallstones or wall thickening visualized. No
sonographic Murphy sign noted by sonographer.

Common bile duct: Diameter: 1 mm, within normal limits.

Liver: No focal lesion identified. Within normal limits in
parenchymal echogenicity. Color Doppler shows patent portal vein
with normal flow direction.

IVC: No abnormality visualized.

Pancreas: Visualized portion unremarkable.

Spleen: Size and appearance within normal limits.

Right Kidney: Length: 9.9 cm. Cortical echogenicity within normal
limits. Increased echogenicity of medullary pyramids noted. No mass
or hydronephrosis visualized.

Left Kidney: Length: 11.0 cm. Cortical echogenicity within normal
limits. Increased echogenicity of the medullary pyramids noted No
mass or hydronephrosis visualized.

Abdominal aorta: No aneurysm visualized.

Other findings: None.
IMPRESSION: No hepatobiliary abnormality identified.

Suspect bilateral medullary nephrocalcinosis. No evidence of
hydronephrosis.

## 2019-05-02 ENCOUNTER — Other Ambulatory Visit: Payer: Self-pay

## 2019-05-02 ENCOUNTER — Telehealth: Payer: Self-pay | Admitting: Family Medicine

## 2019-05-02 MED ORDER — SERTRALINE HCL 50 MG PO TABS: 50.0000 mg | ORAL_TABLET | Freq: Every day | ORAL | 0 refills | Status: DC

## 2019-05-02 NOTE — Telephone Encounter (Signed)
30 d/s sent in, pt was advised.

## 2019-05-02 NOTE — Telephone Encounter (Signed)
Patient refill request - took her last one last night. Scheduled appt with Dr. Anitra Lauth 12/3  sertraline (ZOLOFT) 50 MG tablet  [facility]

## 2019-05-10 ENCOUNTER — Other Ambulatory Visit: Payer: Self-pay

## 2019-05-10 ENCOUNTER — Ambulatory Visit (INDEPENDENT_AMBULATORY_CARE_PROVIDER_SITE_OTHER): Payer: Medicaid Other | Admitting: Family Medicine

## 2019-05-10 ENCOUNTER — Encounter: Payer: Self-pay | Admitting: Family Medicine

## 2019-05-10 VITALS — Temp 97.6°F | Wt 147.0 lb

## 2019-05-10 DIAGNOSIS — F411 Generalized anxiety disorder: Secondary | ICD-10-CM

## 2019-05-10 NOTE — Progress Notes (Signed)
Virtual Visit via Video Note  I connected with pt on 05/10/19 at  8:00 AM EST by a video enabled telemedicine application and verified that I am speaking with the correct person using two identifiers.  Location patient: home Location provider:work or home office Persons participating in the virtual visit: patient, provider  I discussed the limitations of evaluation and management by telemedicine and the availability of in person appointments. The patient expressed understanding and agreed to proceed.   HPI: 33 y/o WF being seen today for 2 mo f/u GAD. A/P as of last visit: "GAD, superimposed adjustment d/o with anxiety due to covid 19 crisis and it's associated restrictions on life. Breast feeding, so cannot use benzos and many antidepressants. Will do trial of sertraline 50 mg qd."  Interim hx: Improved some regarding anxiety. Some wakening in middle of night, takes med at night. Initially had some HA and nausea but this has all resolved. She is happy with the improvements in chronic anxiety.  Worries it may be effecting her breast feeding infant--his sleep is not as good as before but no other changes noted. Appetite and energy level good.  No depression.   ROS: See pertinent positives and negatives per HPI.  Past Medical History:  Diagnosis Date  . Anxiety   . Chronic constipation   . Endometriosis   . Frequent headaches   . History of fatigue   . History of gastritis 2018  . History of gestational diabetes   . History of metrorrhagia   . IBS (irritable bowel syndrome)   . Interstitial cystitis    Urologist dx in remote past--pt questions the accuracy of this.  . 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 Raynaud's disease   . Recurrent UTI    About 4 per year  . Scoliosis     Past Surgical History:  Procedure Laterality Date  . DILATATION & CURRETTAGE/HYSTEROSCOPY WITH RESECTOCOPE N/A 09/04/2012   Procedure: DILATATION &  CURETTAGE/HYSTEROSCOPY WITH RESECTOCOPE;  Surgeon: Juluis Mire, MD;  Location: [facility];  Service: Gynecology;  Laterality: N/A;  . DOPPLER ECHOCARDIOGRAPHY  04/20/2012   EF 60-65%--All normal (Dr. Anne Fu).  . EYE SURGERY     Lasik -bilateral eyes  . LAPAROSCOPY N/A 09/04/2012   Procedure: LAPAROSCOPY OPERATIVE;  Surgeon: Juluis Mire, MD;  Location: [facility];  Service: Gynecology;  Laterality: N/A;  . WISDOM TOOTH EXTRACTION      Family History  Problem Relation Age of Onset  . Depression Mother   . Alcohol abuse Father   . Early death Father   . Cirrhosis Father   . Drug abuse Sister   . Drug abuse Brother   . Asthma Daughter   . Heart disease Paternal Grandfather   . Cancer Maternal Grandmother        Ovary/uterus  . Diabetes Maternal Grandmother   . Diabetes Paternal Grandmother   . Heart attack Paternal Uncle     SOCIAL HX:  Social History   Socioeconomic History  . Marital status: Married    Spouse name: Not on file  . Number of children: Not on file  . Years of education: Not on file  . Highest education level: Not on file  Occupational History  . Not on file  Social Needs  . Financial resource strain: Not on file  . Food insecurity    Worry: Not on file    Inability: Not on file  . Transportation needs    Medical:  Not on file    Non-medical: Not on file  Tobacco Use  . Smoking status: Never Smoker  . Smokeless tobacco: Never Used  Substance and Sexual Activity  . Alcohol use: No  . Drug use: No  . Sexual activity: Yes    Birth control/protection: None    Comment: husband has had a vasectomy  Lifestyle  . Physical activity    Days per week: Not on file    Minutes per session: Not on file  . Stress: Not on file  Relationships  . Social Herbalist on phone: Not on file    Gets together: Not on file    Attends religious service: Not on file    Active member of club or organization: Not on file    Attends meetings of clubs or organizations:  Not on file    Relationship status: Not on file  Other Topics Concern  . Not on file  Social History Narrative   Married, 1 son and 1 daughter.   Educ: associates degree   Occup: homemaker   No T/A/Ds.      Current Outpatient Medications:  .  ibuprofen (ADVIL,MOTRIN) 600 MG tablet, Take 1 tablet (600 mg total) by mouth every 6 (six) hours as needed., Disp: 60 tablet, Rfl: 1 .  Prenatal Vit-Fe Fumarate-FA (PRENATAL MULTIVITAMIN) TABS tablet, Take 1 tablet by mouth at bedtime., Disp: , Rfl:  .  sertraline (ZOLOFT) 50 MG tablet, Take 1 tablet (50 mg total) by mouth daily., Disp: 30 tablet, Rfl: 0 .  vitamin C (ASCORBIC ACID) 500 MG tablet, Take 500 mg by mouth at bedtime., Disp: , Rfl:   EXAM:  VITALS per patient if applicable: There were no vitals taken for this visit.   GENERAL: alert, oriented, appears well and in no acute distress  HEENT: atraumatic, conjunttiva clear, no obvious abnormalities on inspection of external nose and ears  NECK: normal movements of the head and neck  LUNGS: on inspection no signs of respiratory distress, breathing rate appears normal, no obvious gross SOB, gasping or wheezing  CV: no obvious cyanosis  MS: moves all visible extremities without noticeable abnormality  PSYCH/NEURO: pleasant and cooperative, no obvious depression or anxiety, speech and thought processing grossly intact  LABS: none today    Chemistry      Component Value Date/Time   NA 138 11/10/2018 1605   NA 140 07/09/2016   K 3.8 11/10/2018 1605   CL 103 11/10/2018 1605   CO2 24 11/10/2018 1605   BUN 15 11/10/2018 1605   BUN 16 07/09/2016   CREATININE 0.90 11/10/2018 1605   GLU 91 07/09/2016      Component Value Date/Time   CALCIUM 9.5 11/10/2018 1605   ALKPHOS 68 11/10/2018 1605   AST 45 (H) 11/10/2018 1605   ALT 69 (H) 11/10/2018 1605   BILITOT 1.0 11/10/2018 1605      ASSESSMENT AND PLAN:  Discussed the following assessment and plan:  GAD; improved on  sertraline 50mg  qd x the last 2 mo. She'll start taking it in morning to see if sleep issue improves. No change in dosing at this time.  She is hesitant to go up on dose in future and also wants to get off the med as soon as is reasonably feasible.  I recommended she stay on the med a minimum of 6 mo and we would ween off at that time.   -we discussed possible serious and likely etiologies, options for evaluation and  workup, limitations of telemedicine visit vs in person visit, treatment, treatment risks and precautions. Pt prefers to treat via telemedicine empirically rather then risking or undertaking an in person visit at this moment. Patient agrees to seek prompt in person care if worsening, new symptoms arise, or if is not improving with treatment.   I discussed the assessment and treatment plan with the patient. The patient was provided an opportunity to ask questions and all were answered. The patient agreed with the plan and demonstrated an understanding of the instructions.   The patient was advised to call back or seek an in-person evaluation if the symptoms worsen or if the condition fails to improve as anticipated.  F/u: 2 mo  Signed:  Santiago BumpersPhil Shanekia Latella, MD           05/10/2019

## 2019-05-24 ENCOUNTER — Other Ambulatory Visit: Payer: Self-pay | Admitting: Family Medicine

## 2019-07-20 ENCOUNTER — Other Ambulatory Visit: Payer: Self-pay | Admitting: Family Medicine

## 2019-07-20 NOTE — Telephone Encounter (Signed)
Will refill during appt on 2/18.

## 2019-07-26 ENCOUNTER — Ambulatory Visit: Payer: Medicaid Other | Admitting: Family Medicine

## 2019-08-01 ENCOUNTER — Other Ambulatory Visit: Payer: Self-pay

## 2019-08-01 ENCOUNTER — Encounter: Payer: Self-pay | Admitting: Family Medicine

## 2019-08-01 ENCOUNTER — Ambulatory Visit (INDEPENDENT_AMBULATORY_CARE_PROVIDER_SITE_OTHER): Payer: Medicaid Other | Admitting: Family Medicine

## 2019-08-01 VITALS — Temp 98.1°F | Wt 154.0 lb

## 2019-08-01 DIAGNOSIS — F411 Generalized anxiety disorder: Secondary | ICD-10-CM | POA: Diagnosis not present

## 2019-08-01 MED ORDER — ALPRAZOLAM 0.5 MG PO TABS: ORAL_TABLET | ORAL | 1 refills | Status: DC

## 2019-08-01 NOTE — Progress Notes (Signed)
Virtual Visit via Video Note  I connected with Natalie Brennan on 08/01/19 at  1:30 PM EST by a video enabled telemedicine application and verified that I am speaking with the correct person using two identifiers.  Location patient: home Location provider:work or home office Persons participating in the virtual visit: patient, provider  I discussed the limitations of evaluation and management by telemedicine and the availability of in person appointments. The patient expressed understanding and agreed to proceed.  Telemedicine visit is a necessity given the COVID-19 restrictions in place at the current time.  HPI: 34 y/o WF being seen today for 2 and 1/2 mo f/u GAD. A/P as of last visit: "GAD; improved on sertraline 50mg  qd x the last 2 mo. She'll start taking it in morning to see if sleep issue improves. No change in dosing at this time.  She is hesitant to go up on dose in future and also wants to get off the med as soon as is reasonably feasible.  I recommended she stay on the med a minimum of 6 mo and we would ween off at that time."  Interim hx: Has been on sertraline 50mg  qd for 4 mo now. Has been feeling good since last f/u. No panic, feels like she is doing fine to come off sertraline so she cut her dose back to 1/2 of 50mg  tab qd about 2 wks ago.  Had mild inc in anxiety for a couple days and this went away.  No panic.  No depression. She has weaned her baby girls breast feeding down to just a middle of the night feeding, with plan of stopping this feeding in the next month or so (she is almost 36 yr old now).  No new complaints.  She requests going back on prn use of alprazolam.  This has worked well for her in the past and she took it sparingly and responsibly.    PMP AWARE reviewed today: most recent rx for alprazolam 0.5mg  was filled 10/14/17, # 67, rx by me. No red flags.    ROS: See pertinent positives and negatives per HPI.  Past Medical History:  Diagnosis Date  . Anxiety    . Chronic constipation   . Endometriosis   . Frequent headaches   . History of fatigue   . History of gastritis 2018  . History of gestational diabetes   . History of metrorrhagia   . IBS (irritable bowel syndrome)   . Interstitial cystitis    Urologist dx in remote past--Natalie Brennan questions the accuracy of this.  . 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 Raynaud's disease   . Recurrent UTI    About 4 per year  . Scoliosis     Past Surgical History:  Procedure Laterality Date  . DILATATION & CURRETTAGE/HYSTEROSCOPY WITH RESECTOCOPE N/A 09/04/2012   Procedure: DILATATION & CURETTAGE/HYSTEROSCOPY WITH RESECTOCOPE;  Surgeon: Darlyn Chamber, MD;  Location: [facility];  Service: Gynecology;  Laterality: N/A;  . DOPPLER ECHOCARDIOGRAPHY  04/20/2012   EF 60-65%--All normal (Dr. Marlou Porch).  . EYE SURGERY     Lasik -bilateral eyes  . LAPAROSCOPY N/A 09/04/2012   Procedure: LAPAROSCOPY OPERATIVE;  Surgeon: Darlyn Chamber, MD;  Location: [facility];  Service: Gynecology;  Laterality: N/A;  . WISDOM TOOTH EXTRACTION      Family History  Problem Relation Age of Onset  . Depression Mother   . Alcohol abuse Father   . Early death Father   . Cirrhosis  Father   . Drug abuse Sister   . Drug abuse Brother   . Asthma Daughter   . Heart disease Paternal Grandfather   . Cancer Maternal Grandmother        Ovary/uterus  . Diabetes Maternal Grandmother   . Diabetes Paternal Grandmother   . Heart attack Paternal Uncle     Social History   Socioeconomic History  . Marital status: Married    Spouse name: Not on file  . Number of children: Not on file  . Years of education: Not on file  . Highest education level: Not on file  Occupational History  . Not on file  Tobacco Use  . Smoking status: Never Smoker  . Smokeless tobacco: Never Used  Substance and Sexual Activity  . Alcohol use: No  . Drug use: No  . Sexual activity: Yes    Birth control/protection: None    Comment:  husband has had a vasectomy  Other Topics Concern  . Not on file  Social History Narrative   Married, 1 son and 1 daughter.   Educ: associates degree   Occup: homemaker   No T/A/Ds.   Social Determinants of Health   Financial Resource Strain:   . Difficulty of Paying Living Expenses: Not on file  Food Insecurity:   . Worried About Programme researcher, broadcasting/film/video in the Last Year: Not on file  . Ran Out of Food in the Last Year: Not on file  Transportation Needs:   . Lack of Transportation (Medical): Not on file  . Lack of Transportation (Non-Medical): Not on file  Physical Activity:   . Days of Exercise per Week: Not on file  . Minutes of Exercise per Session: Not on file  Stress:   . Feeling of Stress : Not on file  Social Connections:   . Frequency of Communication with Friends and Family: Not on file  . Frequency of Social Gatherings with Friends and Family: Not on file  . Attends Religious Services: Not on file  . Active Member of Clubs or Organizations: Not on file  . Attends Banker Meetings: Not on file  . Marital Status: Not on file      Current Outpatient Medications:  .  ibuprofen (ADVIL,MOTRIN) 600 MG tablet, Take 1 tablet (600 mg total) by mouth every 6 (six) hours as needed., Disp: 60 tablet, Rfl: 1 .  Prenatal Vit-Fe Fumarate-FA (PRENATAL MULTIVITAMIN) TABS tablet, Take 1 tablet by mouth at bedtime., Disp: , Rfl:  .  sertraline (ZOLOFT) 50 MG tablet, TAKE 1 TABLET BY MOUTH EVERY DAY, Disp: 90 tablet, Rfl: 0 .  vitamin C (ASCORBIC ACID) 500 MG tablet, Take 500 mg by mouth at bedtime., Disp: , Rfl:   EXAM:  VITALS per patient if applicable: Temp 98.1 F (36.7 C) (Temporal)   Wt 154 lb (69.9 kg)   BMI 26.43 kg/m    GENERAL: alert, oriented, appears well and in no acute distress  HEENT: atraumatic, conjunttiva clear, no obvious abnormalities on inspection of external nose and ears  NECK: normal movements of the head and neck  LUNGS: on inspection no  signs of respiratory distress, breathing rate appears normal, no obvious gross SOB, gasping or wheezing  CV: no obvious cyanosis  MS: moves all visible extremities without noticeable abnormality  PSYCH/NEURO: pleasant and cooperative, no obvious depression or anxiety, speech and thought processing grossly intact  LABS: none today    Chemistry      Component Value Date/Time  NA 138 11/10/2018 1605   NA 140 07/09/2016 0000   K 3.8 11/10/2018 1605   CL 103 11/10/2018 1605   CO2 24 11/10/2018 1605   BUN 15 11/10/2018 1605   BUN 16 07/09/2016 0000   CREATININE 0.90 11/10/2018 1605   GLU 91 07/09/2016 0000      Component Value Date/Time   CALCIUM 9.5 11/10/2018 1605   ALKPHOS 68 11/10/2018 1605   AST 45 (H) 11/10/2018 1605   ALT 69 (H) 11/10/2018 1605   BILITOT 1.0 11/10/2018 1605     Lab Results  Component Value Date   TSH 2.35 07/09/2016    ASSESSMENT AND PLAN:  Discussed the following assessment and plan:  GAD, MUCH better now.  She strongly desires to get off sertraline altogether. I gave her the option of stopping it completely now since she had no signif problems dropping from 50 to 25mg  dosing 2 wks ago.  Also gave option of taking 12.5 mg qd for the next week and then stopping vs 25 mg every other day for 5-7 doses and then stopping. Also, I agreed to get her back on alprazolam 0.5mg , 1 bid prn, #60, RF x 1. She understands I need to do f/u with her minimum of q35mo while she is on this med.   I discussed the assessment and treatment plan with the patient. The patient was provided an opportunity to ask questions and all were answered. The patient agreed with the plan and demonstrated an understanding of the instructions.   The patient was advised to call back or seek an in-person evaluation if the symptoms worsen or if the condition fails to improve as anticipated.   Signed:  5mo, MD           08/01/2019

## 2019-10-21 IMAGING — US ULTRASOUND ABDOMEN LIMITED
1 series · 14 of 25 positions shown · non-contrast
Comparison: Abdominal ultrasound dated January 13, 2017.

CLINICAL DATA: Right upper quadrant pain for the past day.

EXAM:
ULTRASOUND ABDOMEN LIMITED RIGHT UPPER QUADRANT

[Series 1: ultrasound abdomen limited · 14 of 55 slices shown]
[im 1/55]
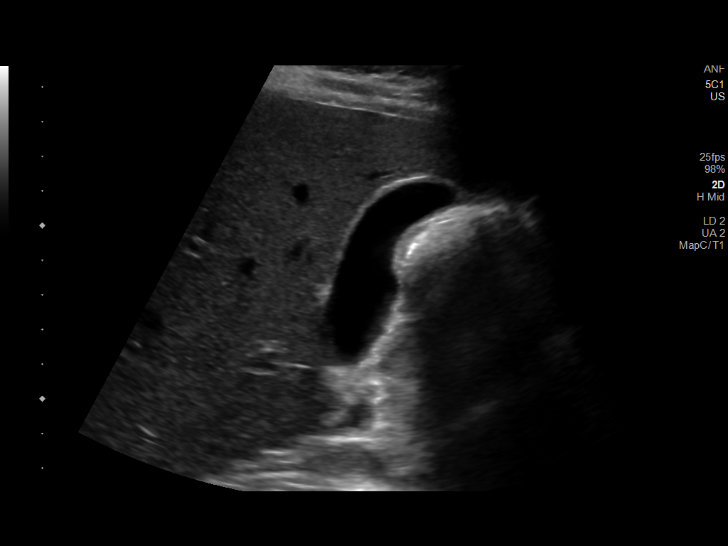
[im 5/55]
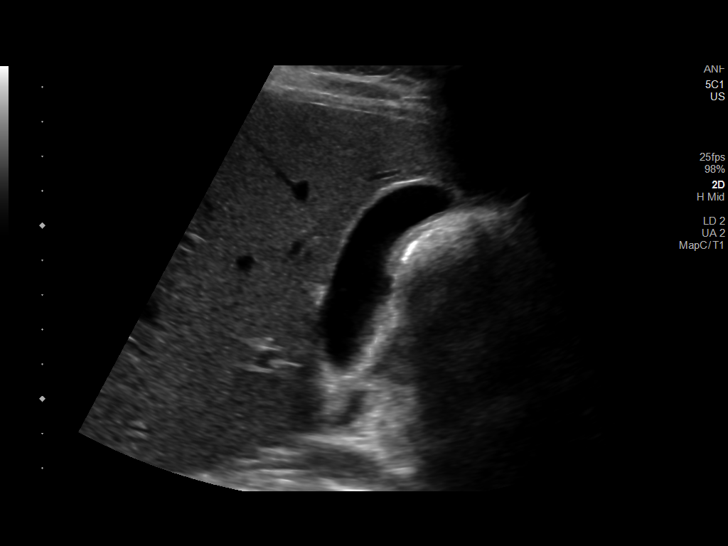
[im 10/55]
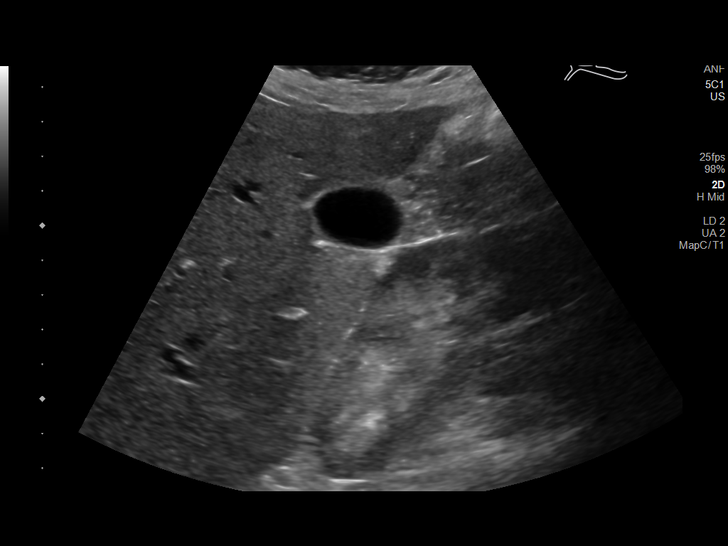
[im 14/55]
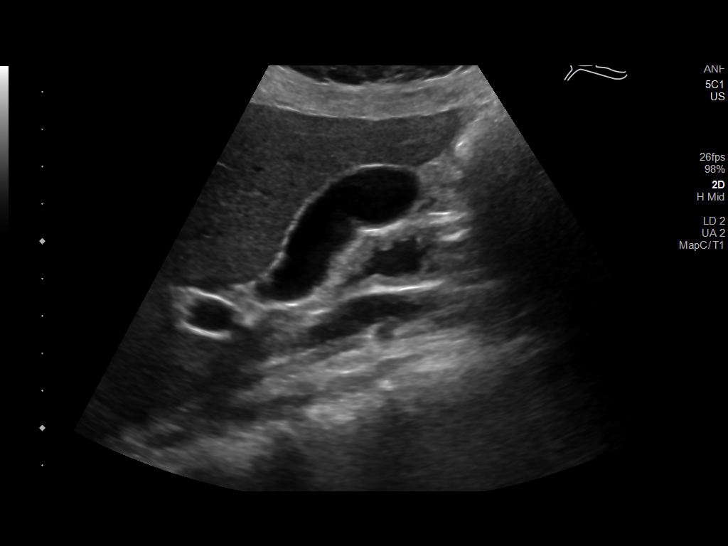
[im 19/55]
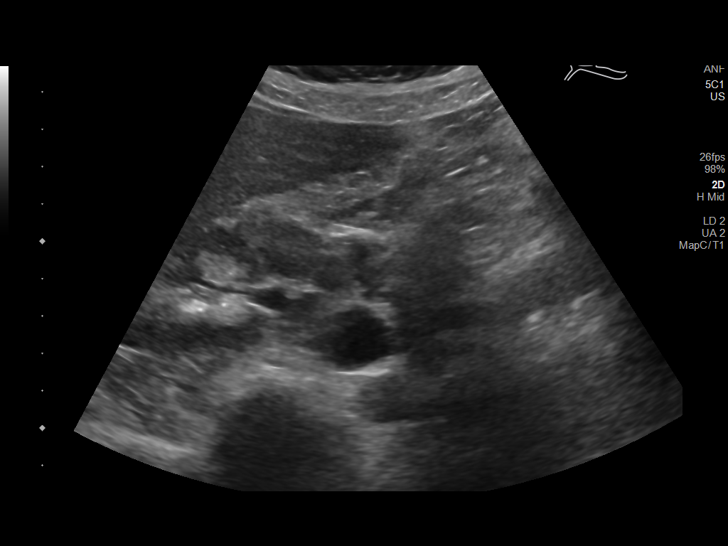
[im 21/55]
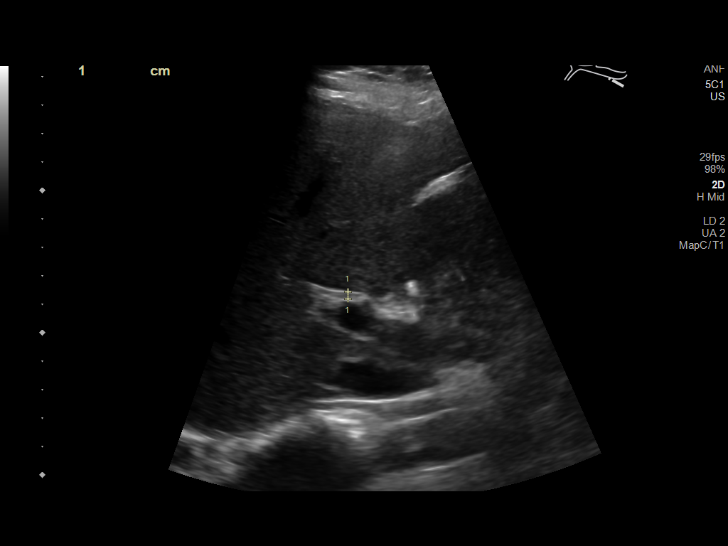
[im 25/55]
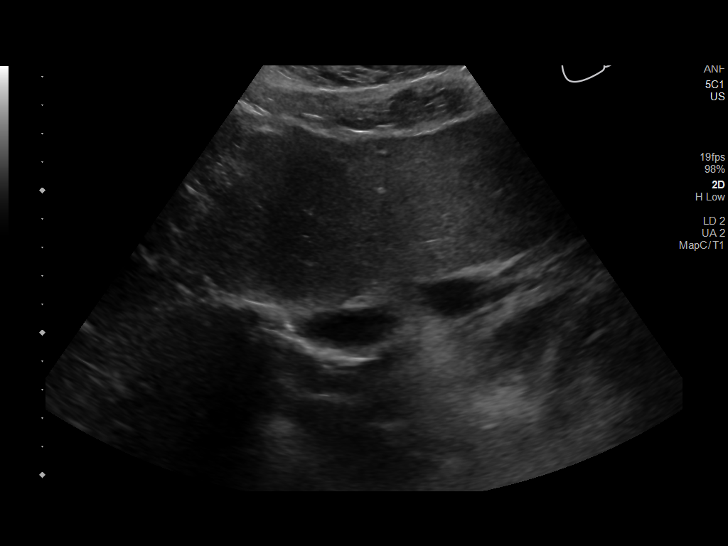
[im 30/55]
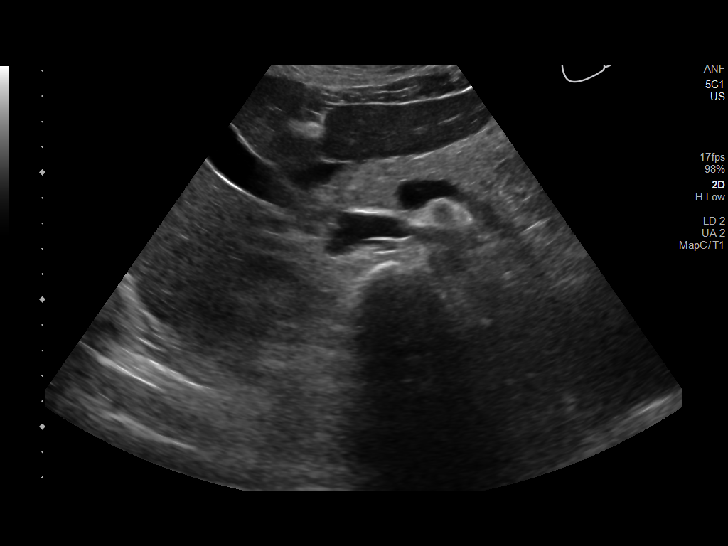
[im 34/55]
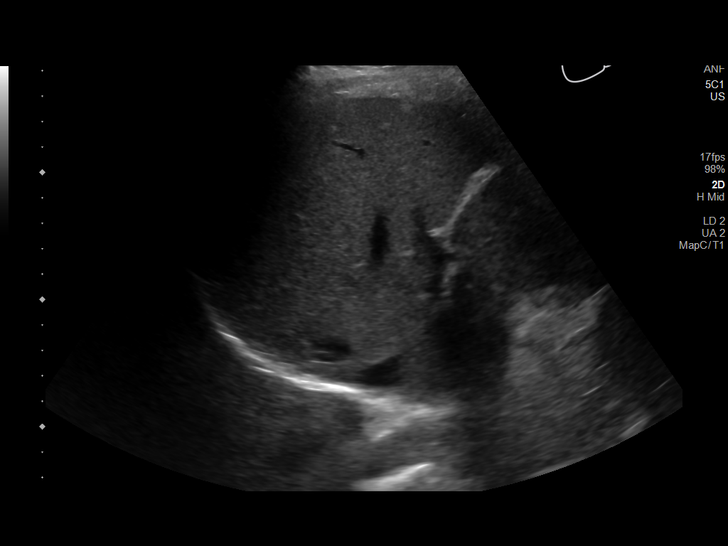
[im 37/55]
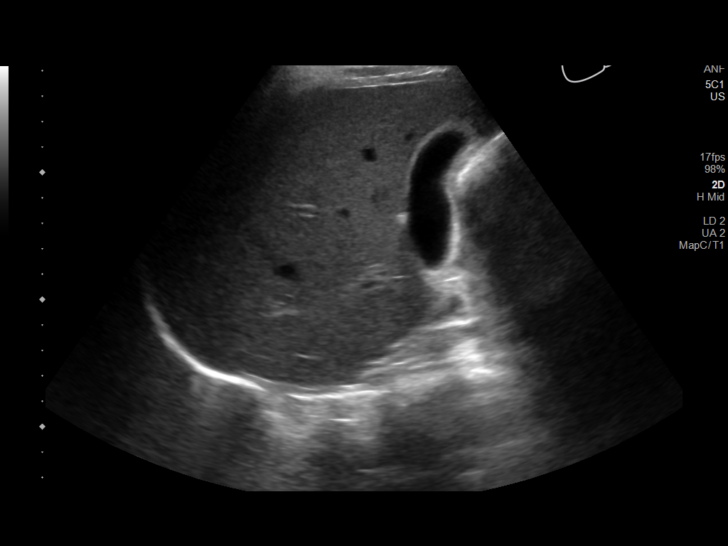
[im 41/55]
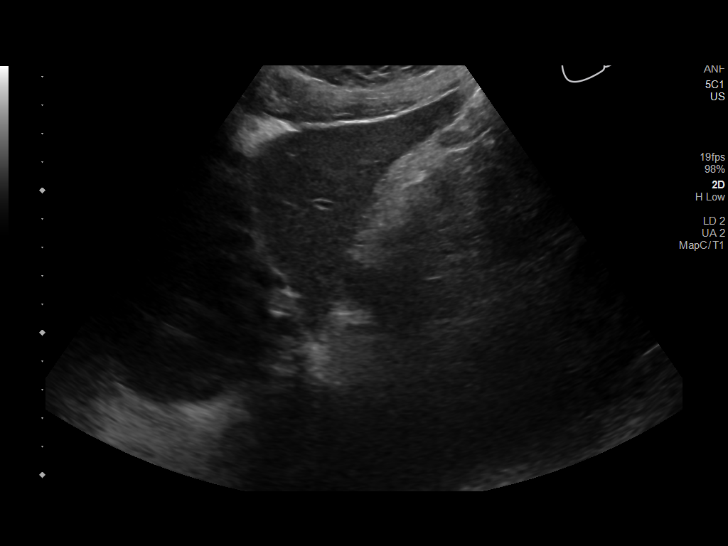
[im 46/55]
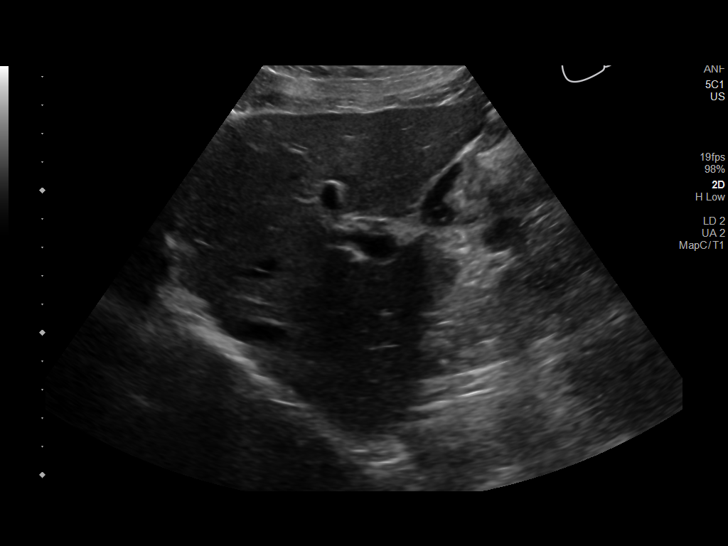
[im 50/55]
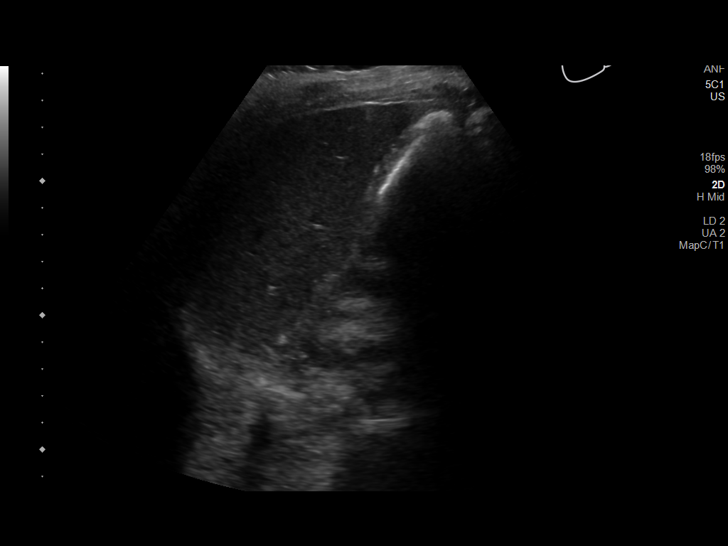
[im 55/55]
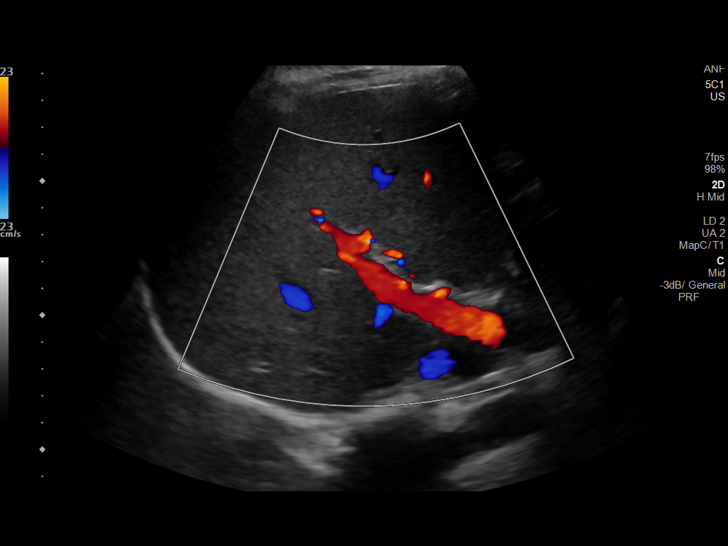

[14 of 25 positions shown; findings below may reference images not displayed]

FINDINGS: Gallbladder:

No gallstones or wall thickening visualized. No sonographic Murphy
sign noted by sonographer.

Common bile duct:

Diameter: 3 mm, normal.

Liver:

No focal lesion identified. Within normal limits in parenchymal
echogenicity. Portal vein is patent on color Doppler imaging with
normal direction of blood flow towards the liver.
IMPRESSION: Normal right upper quadrant ultrasound.

## 2019-11-06 DIAGNOSIS — Z01419 Encounter for gynecological examination (general) (routine) without abnormal findings: Secondary | ICD-10-CM | POA: Diagnosis not present

## 2019-11-06 DIAGNOSIS — Z0000 Encounter for general adult medical examination without abnormal findings: Secondary | ICD-10-CM | POA: Diagnosis not present

## 2019-11-13 DIAGNOSIS — Z1321 Encounter for screening for nutritional disorder: Secondary | ICD-10-CM | POA: Diagnosis not present

## 2019-11-13 DIAGNOSIS — Z13228 Encounter for screening for other metabolic disorders: Secondary | ICD-10-CM | POA: Diagnosis not present

## 2019-11-13 DIAGNOSIS — Z13220 Encounter for screening for lipoid disorders: Secondary | ICD-10-CM | POA: Diagnosis not present

## 2019-11-13 DIAGNOSIS — Z1329 Encounter for screening for other suspected endocrine disorder: Secondary | ICD-10-CM | POA: Diagnosis not present

## 2020-01-01 DIAGNOSIS — E559 Vitamin D deficiency, unspecified: Secondary | ICD-10-CM | POA: Diagnosis not present

## 2020-03-26 DIAGNOSIS — Z20822 Contact with and (suspected) exposure to covid-19: Secondary | ICD-10-CM | POA: Diagnosis not present

## 2020-03-27 ENCOUNTER — Other Ambulatory Visit: Payer: Self-pay | Admitting: Family Medicine

## 2020-03-27 NOTE — Telephone Encounter (Signed)
Pt's last appt was 08/01/19, last refill was completed same date as well. #60 with 1 refill. She was due to f/u every 6 months while on this medication.

## 2020-03-27 NOTE — Telephone Encounter (Signed)
Requesting: alprazolam Contract:n/a UDS:n/a Last Visit:08/01/19 Next Visit: advised to f/u 6 months Last Refill:08/01/19(60,1)  Please Advise. Patient was made aware appt needed for refill. See message below Medication pending.

## 2020-03-27 NOTE — Telephone Encounter (Signed)
LM for pt to return call to schedule.

## 2020-03-27 NOTE — Telephone Encounter (Signed)
Patient returned call. Does not understand about not getting refill for her Xanax. She said that she shouldn't have to come in for a visit, and requests to ask Dr. Milinda Cave for another refill.  Patient can be reached at [phone]

## 2020-03-28 NOTE — Telephone Encounter (Signed)
#  15 eRx'd today. Needs f/u.

## 2020-03-28 NOTE — Telephone Encounter (Signed)
Spoke with patient regarding short supply given and in office appointment needed. Appt scheduled, 11/15

## 2020-04-18 ENCOUNTER — Other Ambulatory Visit: Payer: Self-pay

## 2020-04-21 ENCOUNTER — Other Ambulatory Visit: Payer: Self-pay

## 2020-04-21 ENCOUNTER — Ambulatory Visit (INDEPENDENT_AMBULATORY_CARE_PROVIDER_SITE_OTHER): Payer: Medicaid Other | Admitting: Family Medicine

## 2020-04-21 ENCOUNTER — Encounter: Payer: Self-pay | Admitting: Family Medicine

## 2020-04-21 VITALS — BP 106/72 | HR 72 | Temp 97.7°F | Resp 16 | Ht 64.0 in | Wt 163.2 lb

## 2020-04-21 DIAGNOSIS — F411 Generalized anxiety disorder: Secondary | ICD-10-CM

## 2020-04-21 NOTE — Progress Notes (Signed)
CC: f/u GAD  HPI:  Natalie Brennan is a 34 yo female with past medical history significant for GAD who presents to the clinic today for f/u for GAD.  At last visit, patient was feeling well and had no panic attacks and was wishing to discontinue sertraline, She had cut back dose to 1/2 tab of 50 mg tab qd with brief mild anxiety initially which subsided. No panic, no depression at that time. At that time, she was given option to discontinue sertraline completely or option of taking 12.5 mg qd for 1 week post-visit and then stopping vs 25 mg every other day for 5-7 doses then stopping. At last visit, she was also started back on alprazolam 0.5 mg, 1 qd PRN, #60, RF x 1.  Today, she reports that she discontinued the sertraline since last visit. She states that she does still experience some mild anxiety and has considered potentially seeking additional pharmacotherapy. She does have few episodes of panic/anxiety for which she uses prescribed alprazolam to mitigate. She has joined the gym which has helped with symptoms. Patient is slightly concerned about winter coming as she states she has been "seasonal" in the past. She states that she would like to see how things go, and if she feels like she is experiencing anxiety or more days then not then she will call to make appointment. Of note, if she seeks pharmacotherapy in the future, she says she would like another agent besides sertraline. She states that she feels as thought the Sertraline made her too "mellow."   PMH: Past Medical History:  Diagnosis Date  . Anxiety   . Chronic constipation   . Endometriosis   . Frequent headaches   . History of fatigue   . History of gastritis 2018  . History of gestational diabetes   . History of metrorrhagia   . IBS (irritable bowel syndrome)   . Interstitial cystitis    Urologist dx in remote past--pt questions the accuracy of this.  . 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  Raynaud's disease   . Recurrent UTI    About 4 per year  . Scoliosis     M/A: Current Outpatient Medications on File Prior to Visit  Medication Sig Dispense Refill  . ALPRAZolam (XANAX) 0.5 MG tablet TAKE 1 TABLET BY MOUTH TWICE A DAY AS NEEDED FOR ANXIETY 15 tablet 0  . Cholecalciferol 1.25 MG (50000 UT) capsule cholecalciferol (vitamin D3) 1,250 mcg (50,000 unit) capsule  Take 1 capsule every week by oral route.    Marland Kitchen ibuprofen (ADVIL,MOTRIN) 600 MG tablet Take 1 tablet (600 mg total) by mouth every 6 (six) hours as needed. 60 tablet 1  . Prenatal Vit-Fe Fumarate-FA (PRENATAL MULTIVITAMIN) TABS tablet Take 1 tablet by mouth at bedtime.    . sertraline (ZOLOFT) 50 MG tablet TAKE 1 TABLET BY MOUTH EVERY DAY 90 tablet 0  . vitamin C (ASCORBIC ACID) 500 MG tablet Take 500 mg by mouth at bedtime.     No current facility-administered medications on file prior to visit.   No Known Allergies  FH: Family History  Problem Relation Age of Onset  . Depression Mother   . Alcohol abuse Father   . Early death Father   . Cirrhosis Father   . Drug abuse Sister   . Drug abuse Brother   . Asthma Daughter   . Heart disease Paternal Grandfather   . Cancer Maternal Grandmother  Ovary/uterus  . Diabetes Maternal Grandmother   . Diabetes Paternal Grandmother   . Heart attack Paternal Uncle     SH: Social History   Socioeconomic History  . Marital status: Married    Spouse name: Not on file  . Number of children: Not on file  . Years of education: Not on file  . Highest education level: Not on file  Occupational History  . Not on file  Tobacco Use  . Smoking status: Never Smoker  . Smokeless tobacco: Never Used  Vaping Use  . Vaping Use: Never used  Substance and Sexual Activity  . Alcohol use: No  . Drug use: No  . Sexual activity: Yes    Birth control/protection: None    Comment: husband has had a vasectomy  Other Topics Concern  . Not on file  Social History Narrative    Married, 1 son and 1 daughter.   Educ: associates degree   Occup: homemaker   No T/A/Ds.   Social Determinants of Health   Financial Resource Strain:   . Difficulty of Paying Living Expenses: Not on file  Food Insecurity:   . Worried About Programme researcher, broadcasting/film/video in the Last Year: Not on file  . Ran Out of Food in the Last Year: Not on file  Transportation Needs:   . Lack of Transportation (Medical): Not on file  . Lack of Transportation (Non-Medical): Not on file  Physical Activity:   . Days of Exercise per Week: Not on file  . Minutes of Exercise per Session: Not on file  Stress:   . Feeling of Stress : Not on file  Social Connections:   . Frequency of Communication with Friends and Family: Not on file  . Frequency of Social Gatherings with Friends and Family: Not on file  . Attends Religious Services: Not on file  . Active Member of Clubs or Organizations: Not on file  . Attends Banker Meetings: Not on file  . Marital Status: Not on file    ROS: Review of Systems  Constitutional: Negative for fever and malaise/fatigue.  Eyes: Negative for blurred vision.  Respiratory: Negative for cough.   Cardiovascular: Negative for chest pain and palpitations.  Gastrointestinal: Negative for abdominal pain, nausea and vomiting.  Neurological: Negative for dizziness, tremors, weakness and headaches.  Psychiatric/Behavioral: The patient is nervous/anxious.     PE: Vitals with BMI 08/01/2019 05/10/2019 12/01/2018  Height - - -  Weight 154 lbs 147 lbs 147 lbs  BMI - - -  Systolic - - -  Diastolic - - -  Pulse - - -    Physical Exam Vitals reviewed.  Constitutional:      General: She is not in acute distress.    Appearance: Normal appearance.  HENT:     Head: Normocephalic and atraumatic.     Mouth/Throat:     Mouth: Mucous membranes are moist.  Eyes:     Extraocular Movements: Extraocular movements intact.  Cardiovascular:     Rate and Rhythm: Normal rate and  regular rhythm.     Heart sounds: No murmur heard.  No friction rub. No gallop.   Pulmonary:     Effort: Pulmonary effort is normal.     Breath sounds: Normal breath sounds.  Abdominal:     General: Bowel sounds are normal.  Musculoskeletal:        General: Normal range of motion.  Skin:    General: Skin is warm and dry.  Neurological:  General: No focal deficit present.     Mental Status: She is alert and oriented to person, place, and time.  Psychiatric:        Mood and Affect: Mood and affect normal.        Behavior: Behavior normal.     Comments: Reports occasional mild anxiety     Labs: No results found for this or any previous visit (from the past 2160 hour(s)).   A/P: In summary, Peggie Hornak is a 34 y.o. year old female with a past medical history of GAD who presents to the clinic for f/u GAD. Physical exam unremarkable. Patient discontinued sertraline since last visit. Has improved, but still experiencing mild anxiety. Some more intense episode for which she uses alprazolam PRN with good effect. Discussed potentially starting another agent to help with anxiety, patient slightly concerned with winter coming as she says she has been "seasonal" in the past. Patient would like to wait and see if she can manage anxiety with lifestyle modifications (such as exercising) before considering medication management. Will reassess in 6 months or as needed if patient starts experiencing worsening symptoms.  The patient was advised to call back or seek an in-person evaluation if the symptoms worsen or if the condition fails to improve as anticipated.   Lab Orders  No laboratory test(s) ordered today     Follow Up:  6 months for follow up GAD  Signed: Sol Blazing, MS3

## 2020-04-21 NOTE — Progress Notes (Signed)
See student note above. I personally was present during the history, physical exam, and medical decision-making activities of this service and have verified that the service and findings are accurately documented in the student's note.  Sertraline, even on low dose, caused some blunting of affect or "too mellow" plus caused wt gain, so if new anti-anxiety med needed in future will try wellbutrin. Additionally, most recent rx for alprazolam was filled 03/28/20, #15, rx by me, no red flags. Will authorize further RF's of alprazolam when pt requests in near future. Her use of this med is appropriate, no concerns for abuse/diversion. No new rx was given for this medication today.  Signed:  Santiago Bumpers, MD           04/21/2020

## 2020-04-24 ENCOUNTER — Encounter: Payer: Self-pay | Admitting: Family Medicine

## 2020-04-25 ENCOUNTER — Telehealth: Payer: Self-pay

## 2020-04-25 MED ORDER — BUPROPION HCL ER (XL) 150 MG PO TB24: 150.0000 mg | ORAL_TABLET | Freq: Every day | ORAL | 1 refills | Status: DC

## 2020-04-25 NOTE — Telephone Encounter (Signed)
Patient was last seen 04/21/20 and would like to try recommended medication. It was mentioned in note "Sertraline, even on low dose, caused some blunting of affect or "too mellow" plus caused wt gain, so if new anti-anxiety med needed in future will try wellbutrin".   Please advise, thanks.

## 2020-04-25 NOTE — Telephone Encounter (Signed)
OK, sent in wellbutrin xl. Needs f/u 1 mo after starting med.

## 2020-04-26 NOTE — Telephone Encounter (Signed)
Patient's husband advised, okay per DPR.

## 2020-05-25 ENCOUNTER — Other Ambulatory Visit: Payer: Self-pay | Admitting: Family Medicine

## 2020-06-19 DIAGNOSIS — Z20822 Contact with and (suspected) exposure to covid-19: Secondary | ICD-10-CM | POA: Diagnosis not present

## 2020-06-30 ENCOUNTER — Telehealth: Payer: Self-pay

## 2020-06-30 NOTE — Telephone Encounter (Signed)
Appt scheduled for 1/28 with PCP for further evaluation.  [facility] Day - Client TELEPHONE ADVICE RECORD AccessNurse Patient Name: Natalie Brennan Gender: Female DOB: [birth year] Age: 35 Y 4 M 12 D Return Phone Number: [phone] (Primary) Address: City/State/Zip: [place] [address] Client [facility] Day - Client Client Site [facility] - Day Physician Santiago Bumpers - MD Contact Type Call Who Is Calling Patient / Member / Family / Caregiver Call Type Triage / Clinical Relationship To Patient Self Return Phone Number [phone] (Primary) Chief Complaint CHEST PAIN (>=21 years) - pain, pressure, heaviness or tightness Reason for Call Symptomatic / Request for Health Information Initial Comment Caller states, wanting an appt made. Pt has chest pain. Translation No Nurse Assessment Nurse: Doylene Canard, RN, Lesa Date/Time (Eastern Time): 06/30/2020 10:44:13 AM Confirm and document reason for call. If symptomatic, describe symptoms. ---Caller states she has chest pain Does the patient have any new or worsening symptoms? ---Yes Will a triage be completed? ---Yes Related visit to physician within the last 2 weeks? ---No Does the PT have any chronic conditions? (i.e. diabetes, asthma, this includes High risk factors for pregnancy, etc.) ---No Is the patient pregnant or possibly pregnant? (Ask all females between the ages of 72-55) ---No Is this a behavioral health or substance abuse call? ---No Guidelines Guideline Title Affirmed Question Affirmed Notes Nurse Date/Time (Eastern Time) Chest Pain [1] Chest pain from known angina comes and goes AND [2] is NOT happening more often (increasing in frequency) or getting worse (increasing in severity) Conner, RN, Lesa 06/30/2020 10:44:54 AM Disp. Time Lamount Cohen Time) Disposition Final User 06/30/2020 10:40:05 AM Send to Urgent Queue Terrilee Files 06/30/2020 10:50:39 AM SEE PCP  WITHIN 3 DAYS Yes Conner, RN, Lesa PLEASE NOTE: All timestamps contained within this report are represented as [place] Standard Time. CONFIDENTIALTY NOTICE: This fax transmission is intended only for the addressee. It contains information that is legally privileged, confidential or otherwise protected from use or disclosure. If you are not the intended recipient, you are strictly prohibited from reviewing, disclosing, copying using or disseminating any of this information or taking any action in reliance on or regarding this information. If you have received this fax in error, please notify us immediately by telephone so that we can arrange for its return to [place]. Phone: [phone], Toll-Free: [phone], Fax: [phone] Page: 2 of 2 Call Id: [id number] Caller Disagree/Comply Comply Caller Understands Yes PreDisposition Did not know what to do Care Advice Given Per Guideline SEE PCP WITHIN 3 DAYS: * You need to be seen within 2 or 3 days. * PCP VISIT: Call your doctor (or NP/PA) during regular office hours and make an appointment. A clinic or urgent care center are good places to go for care if your doctor's office is closed or you can't get an appointment. NOTE: If office will be open tomorrow, tell caller to call then, not in 3 days. BRING MEDICINES: * It is also a good idea to bring the pill bottles too. This will help the doctor to make certain you are taking the right medicines and the right dose. CALL BACK IF: * Difficulty breathing or unusual sweating occurs * Chest pain increases in frequency, duration or severity * Chest pain lasts over 5 minutes * You become worse CARE ADVICE given per Chest Pain (Adult) guideline.

## 2020-07-04 ENCOUNTER — Ambulatory Visit (HOSPITAL_BASED_OUTPATIENT_CLINIC_OR_DEPARTMENT_OTHER)
Admission: RE | Admit: 2020-07-04 | Discharge: 2020-07-04 | Disposition: A | Discharge status: Home / Self Care | Payer: Medicaid Other | Source: Ambulatory Visit | Attending: Family Medicine | Admitting: Family Medicine

## 2020-07-04 ENCOUNTER — Ambulatory Visit (INDEPENDENT_AMBULATORY_CARE_PROVIDER_SITE_OTHER): Payer: Medicaid Other | Admitting: Family Medicine

## 2020-07-04 ENCOUNTER — Encounter: Payer: Self-pay | Admitting: Family Medicine

## 2020-07-04 ENCOUNTER — Other Ambulatory Visit: Payer: Self-pay

## 2020-07-04 VITALS — BP 94/63 | HR 71 | Temp 97.9°F | Resp 16 | Ht 64.0 in | Wt 163.2 lb

## 2020-07-04 DIAGNOSIS — F411 Generalized anxiety disorder: Secondary | ICD-10-CM

## 2020-07-04 DIAGNOSIS — R202 Paresthesia of skin: Secondary | ICD-10-CM | POA: Diagnosis not present

## 2020-07-04 DIAGNOSIS — R079 Chest pain, unspecified: Secondary | ICD-10-CM | POA: Diagnosis not present

## 2020-07-04 DIAGNOSIS — R0789 Other chest pain: Secondary | ICD-10-CM | POA: Diagnosis not present

## 2020-07-04 LAB — COMPREHENSIVE METABOLIC PANEL
ALT: 18 U/L (ref 0–35)
AST: 20 U/L (ref 0–37)
Albumin: 4.1 g/dL (ref 3.5–5.2)
Alkaline Phosphatase: 53 U/L (ref 39–117)
BUN: 10 mg/dL (ref 6–23)
CO2: 27 mEq/L (ref 19–32)
Calcium: 9.2 mg/dL (ref 8.4–10.5)
Chloride: 107 mEq/L (ref 96–112)
Creatinine, Ser: 0.87 mg/dL (ref 0.40–1.20)
GFR: 85.74 mL/min (ref >60.00)
Glucose, Bld: 90 mg/dL (ref 70–99)
Potassium: 4.5 mEq/L (ref 3.5–5.1)
Sodium: 141 mEq/L (ref 135–145)
Total Bilirubin: 0.8 mg/dL (ref 0.2–1.2)
Total Protein: 6.6 g/dL (ref 6.0–8.3)

## 2020-07-04 LAB — CBC WITH DIFFERENTIAL/PLATELET
Basophils Absolute: 0 10*3/uL (ref 0.0–0.1)
Basophils Relative: 0.7 % (ref 0.0–3.0)
Eosinophils Absolute: 0.1 10*3/uL (ref 0.0–0.7)
Eosinophils Relative: 1.6 % (ref 0.0–5.0)
HCT: 40.1 % (ref 36.0–46.0)
Hemoglobin: 13.8 g/dL (ref 12.0–15.0)
Lymphocytes Relative: 38.9 % (ref 12.0–46.0)
Lymphs Abs: 2.1 10*3/uL (ref 0.7–4.0)
MCHC: 34.3 g/dL (ref 30.0–36.0)
MCV: 91.8 fl (ref 78.0–100.0)
Monocytes Absolute: 0.5 10*3/uL (ref 0.1–1.0)
Monocytes Relative: 9.9 % (ref 3.0–12.0)
Neutro Abs: 2.6 10*3/uL (ref 1.4–7.7)
Neutrophils Relative %: 48.9 % (ref 43.0–77.0)
Platelets: 254 10*3/uL (ref 150.0–400.0)
RBC: 4.37 Mil/uL (ref 3.87–5.11)
RDW: 12.6 % (ref 11.5–15.5)
WBC: 5.4 10*3/uL (ref 4.0–10.5)

## 2020-07-04 LAB — TSH: TSH: 1.23 u[IU]/mL (ref 0.35–4.50)

## 2020-07-04 MED ORDER — ALPRAZOLAM 0.5 MG PO TABS: ORAL_TABLET | ORAL | 1 refills | Status: DC

## 2020-07-04 NOTE — Progress Notes (Signed)
OFFICE VISIT  07/04/2020  CC:  Chief Complaint  Patient presents with  . Chest Pain    Occurred when she started working out again and tested positive for covid on 1/13 and since then has worsened.   HPI:    Patient is a 35 y.o. Caucasian female who presents for chest pain. Starting approx 2 mo ago, central/L upper chest pain extends into upper back.  Mild intensity, character hard for her to describe, ?ache, occ sharp.  few times then resolved. Then returned a few weeks ago and has happened several times, woke her up from sleep a couple times---some pins and needles sensation in hands/feet at same time. No palpitations, no signif dizziness, no vertigo. Duration of pain is hard to discern, seems to come and go throughout some days, other days not there at all. +nausea assoc, sometimes breaks out in sweats---she is particularly perplexed b/c the episodes sometimes wake her up from sleep.  Says L side of face red/feels flushed at times. No SOB/DOE.  Does some CV workouts and no sx's are triggered.  Covid illness beginning aound Jan 1 (Fever, chills, aches, fatigue---signif sx's lasted about 12 d.)-- PCR positive 06/19/20-->sounds like latest round of physical sx's/probs started around the tail end of this illness for her.   I started her on wellbutrin for her anxiety on 04/25/20--she took it 3 wks and says it triggered paranoia so she stopped it.  ROS, see above, plus-> no fevers,no wheezing, no cough, no dizziness, no HAs, no rashes, no melena/hematochezia.  No polyuria or polydipsia.  No myalgias or arthralgias.  No focal weakness or tremors.  No acute vision or hearing abnormalities. No vomiting,diarrhea, or abd pain.  No palpitations.     Past Medical History:  Diagnosis Date  . Anxiety   . Chronic constipation   . Endometriosis   . Frequent headaches   . History of fatigue   . History of gastritis 2018  . History of gestational diabetes   . History of metrorrhagia   . IBS  (irritable bowel syndrome)   . Interstitial cystitis    Urologist dx in remote past--pt questions the accuracy of this.  . 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 Raynaud's disease   . Recurrent UTI    About 4 per year  . Scoliosis     Past Surgical History:  Procedure Laterality Date  . DILATATION & CURRETTAGE/HYSTEROSCOPY WITH RESECTOCOPE N/A 09/04/2012   Procedure: DILATATION & CURETTAGE/HYSTEROSCOPY WITH RESECTOCOPE;  Surgeon: Juluis Mire, MD;  Location: [facility];  Service: Gynecology;  Laterality: N/A;  . DOPPLER ECHOCARDIOGRAPHY  04/20/2012   EF 60-65%--All normal (Dr. Anne Fu).  . EYE SURGERY     Lasik -bilateral eyes  . LAPAROSCOPY N/A 09/04/2012   Procedure: LAPAROSCOPY OPERATIVE;  Surgeon: Juluis Mire, MD;  Location: [facility];  Service: Gynecology;  Laterality: N/A;  . WISDOM TOOTH EXTRACTION      Outpatient Medications Prior to Visit  Medication Sig Dispense Refill  . Cholecalciferol 1.25 MG (50000 UT) capsule cholecalciferol (vitamin D3) 1,250 mcg (50,000 unit) capsule  Take 1 capsule every week by oral route.    . Prenatal Vit-Fe Fumarate-FA (PRENATAL MULTIVITAMIN) TABS tablet Take 1 tablet by mouth at bedtime.    . vitamin C (ASCORBIC ACID) 500 MG tablet Take 500 mg by mouth at bedtime.    . ALPRAZolam (XANAX) 0.5 MG tablet TAKE 1 TABLET BY MOUTH TWICE A DAY AS NEEDED FOR ANXIETY 15  tablet 0  . ibuprofen (ADVIL,MOTRIN) 600 MG tablet Take 1 tablet (600 mg total) by mouth every 6 (six) hours as needed. (Patient not taking: No sig reported) 60 tablet 1  . buPROPion (WELLBUTRIN XL) 150 MG 24 hr tablet Take 1 tablet (150 mg total) by mouth daily. (Patient not taking: Reported on 07/04/2020) 30 tablet 1   No facility-administered medications prior to visit.    No Known Allergies  ROS As per HPI  PE: Vitals with BMI 07/04/2020 04/21/2020 08/01/2019  Height 5\' 4"  5\' 4"  -  Weight 163 lbs 3 oz 163 lbs 3 oz 154 lbs  BMI 28 28 -  Systolic 94 106 -   Diastolic 63 72 -  Pulse 71 72 -     Exam chaperoned by , CMA Gen: Alert, well appearing.  Patient is oriented to person, place, time, and situation. : no injection, icteris, swelling, or exudate.  EOMI, PERRLA. Mouth: lips without lesion/swelling.  Oral mucosa pink and moist. Oropharynx without erythema, exudate, or swelling.  Neck - No masses or thyromegaly or limitation in range of motion CV: RRR, no m/r/g.   LUNGS: CTA bilat, nonlabored resps, good aeration in all lung fields. No chest wall tenderness. ABD: soft, NT/ND EXT: no clubbing or cyanosis.  no edema.  Neuro: CN 2-12 intact bilaterally, strength 5/5 in proximal and distal upper extremities and lower extremities bilaterally.  No sensory deficits.  No tremor.  No ataxia. No pronator drift.   LABS:    Chemistry      Component Value Date/Time   NA 138 11/10/2018 1605   NA 140 07/09/2016 0000   K 3.8 11/10/2018 1605   CL 103 11/10/2018 1605   CO2 24 11/10/2018 1605   BUN 15 11/10/2018 1605   BUN 16 07/09/2016 0000   CREATININE 0.90 11/10/2018 1605   GLU 91 07/09/2016 0000      Component Value Date/Time   CALCIUM 9.5 11/10/2018 1605   ALKPHOS 68 11/10/2018 1605   AST 45 (H) 11/10/2018 1605   ALT 69 (H) 11/10/2018 1605   BILITOT 1.0 11/10/2018 1605     Lab Results  Component Value Date   WBC 9.0 11/10/2018   HGB 12.5 11/10/2018   HCT 36.3 11/10/2018   MCV 90.5 11/10/2018   PLT 191 11/10/2018   Lab Results  Component Value Date   TSH 2.35 07/09/2016   IMPRESSION AND PLAN:  1) Atypical chest pain, + some accompanying sx's. Low suspicion of serious/ominous cardiopulm etiology. Question post-covid autonomic instability, question anxiety.   Discussed options today and decided to check a few basic labs and a cxr, no new treatments at this time. CBC, CMET, TSH, and cXR ordered.  2) Anxiety: alpraz helpful, uses prn infrequently. Wellbutrin caused paranoia. Sertraline seemed to help in  the past but it's not entirely clear why she wanted to get off of it (she self weaned back in [facility] 2021). Continue alpraz 0.5mg  bid prn. CSC today.  An After Visit Summary was printed and given to the patient.  FOLLOW UP: Return in about 6 weeks (around 08/15/2020) for f/u atypical cp + anxiety.  Signed:  2022, MD           07/04/2020

## 2020-07-07 ENCOUNTER — Telehealth: Payer: Self-pay | Admitting: Family Medicine

## 2020-07-07 NOTE — Telephone Encounter (Signed)
Noted  

## 2020-07-07 NOTE — Telephone Encounter (Signed)
Note: Patient returned call for results. I relayed Dr. Samul Dada message that labs and chext Xray results were all normal. Patient voiced understanding.

## 2020-11-17 DIAGNOSIS — Z01419 Encounter for gynecological examination (general) (routine) without abnormal findings: Secondary | ICD-10-CM | POA: Diagnosis not present

## 2020-11-17 DIAGNOSIS — Z0000 Encounter for general adult medical examination without abnormal findings: Secondary | ICD-10-CM | POA: Diagnosis not present

## 2020-12-15 DIAGNOSIS — Z8041 Family history of malignant neoplasm of ovary: Secondary | ICD-10-CM | POA: Diagnosis not present

## 2021-06-14 IMAGING — DX DG CHEST 2V
2 series · 2 of 2 positions shown · non-contrast
Comparison: 11/12/2009

CLINICAL DATA: Intermittent, nonexertional chest pain for 2 weeks
following recent [id number] infection.

EXAM:
CHEST - 2 VIEW

[chest pa]
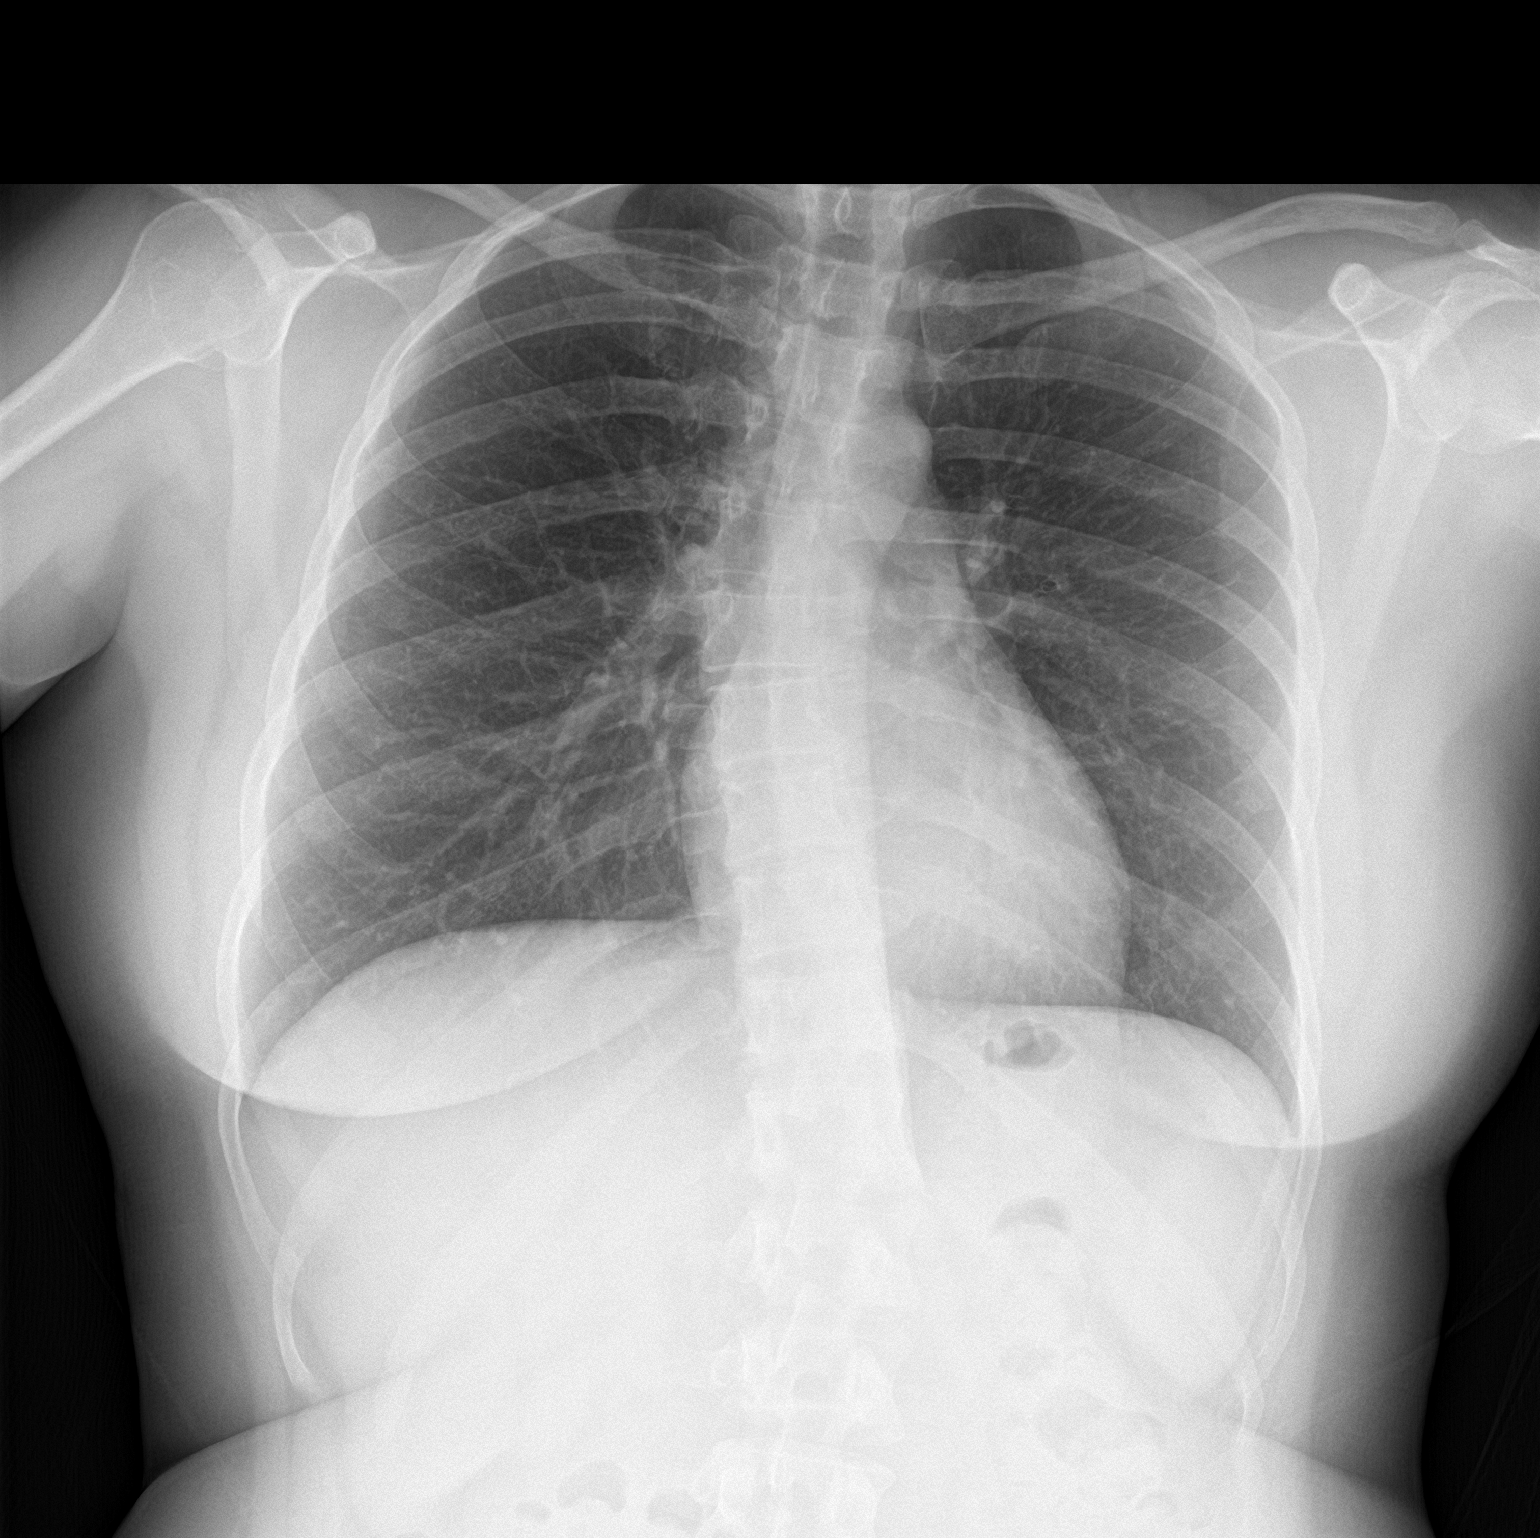

[chest lat]
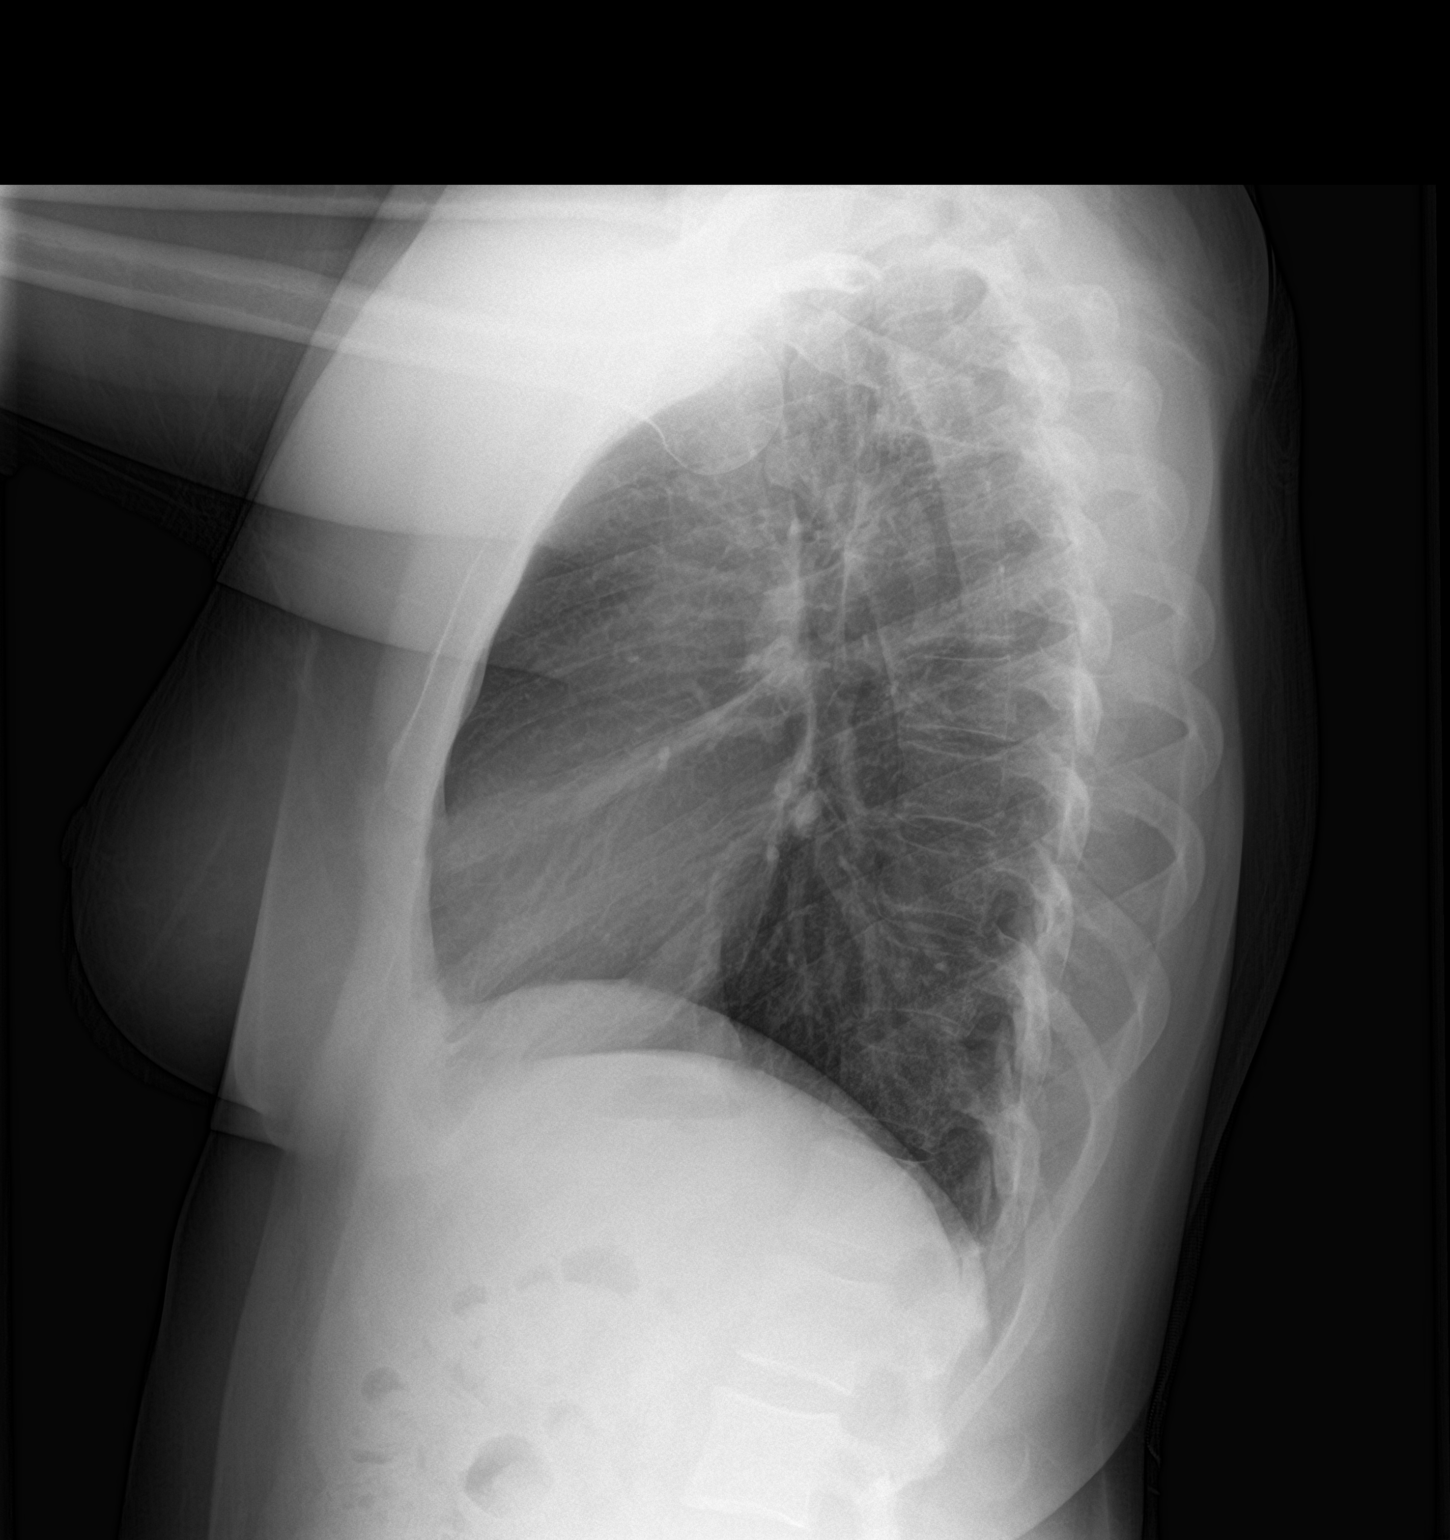

[2 of 2 positions shown; findings below may reference images not displayed]

FINDINGS: Cardiac silhouette is normal in size and configuration. Normal
mediastinal and hilar contours.

Clear lungs.  No pleural effusion or pneumothorax.

S-shaped thoracolumbar scoliosis, stable. Skeletal structures
otherwise unremarkable.
IMPRESSION: No active cardiopulmonary disease.

## 2022-01-13 DIAGNOSIS — Z01419 Encounter for gynecological examination (general) (routine) without abnormal findings: Secondary | ICD-10-CM | POA: Diagnosis not present

## 2022-02-22 DIAGNOSIS — Z1321 Encounter for screening for nutritional disorder: Secondary | ICD-10-CM | POA: Diagnosis not present

## 2022-02-22 DIAGNOSIS — Z8041 Family history of malignant neoplasm of ovary: Secondary | ICD-10-CM | POA: Diagnosis not present

## 2022-02-22 DIAGNOSIS — Z1329 Encounter for screening for other suspected endocrine disorder: Secondary | ICD-10-CM | POA: Diagnosis not present

## 2022-02-22 DIAGNOSIS — Z131 Encounter for screening for diabetes mellitus: Secondary | ICD-10-CM | POA: Diagnosis not present

## 2022-02-22 DIAGNOSIS — Z13220 Encounter for screening for lipoid disorders: Secondary | ICD-10-CM | POA: Diagnosis not present

## 2022-02-22 DIAGNOSIS — N946 Dysmenorrhea, unspecified: Secondary | ICD-10-CM | POA: Diagnosis not present

## 2022-02-22 DIAGNOSIS — Z13228 Encounter for screening for other metabolic disorders: Secondary | ICD-10-CM | POA: Diagnosis not present

## 2022-02-22 DIAGNOSIS — R1031 Right lower quadrant pain: Secondary | ICD-10-CM | POA: Diagnosis not present

## 2022-04-19 DIAGNOSIS — R102 Pelvic and perineal pain: Secondary | ICD-10-CM | POA: Diagnosis not present

## 2022-04-19 DIAGNOSIS — R300 Dysuria: Secondary | ICD-10-CM | POA: Diagnosis not present

## 2022-04-19 DIAGNOSIS — N390 Urinary tract infection, site not specified: Secondary | ICD-10-CM | POA: Diagnosis not present

## 2022-05-24 ENCOUNTER — Other Ambulatory Visit: Payer: Self-pay | Admitting: Obstetrics and Gynecology

## 2022-05-24 DIAGNOSIS — N939 Abnormal uterine and vaginal bleeding, unspecified: Secondary | ICD-10-CM | POA: Diagnosis not present

## 2022-05-24 DIAGNOSIS — R109 Unspecified abdominal pain: Secondary | ICD-10-CM

## 2022-05-24 DIAGNOSIS — R102 Pelvic and perineal pain: Secondary | ICD-10-CM | POA: Diagnosis not present

## 2022-06-07 NOTE — L&D Delivery Note (Signed)
Delivery Note At 6:11 AM a viable female was delivered via Vaginal, Spontaneous (Presentation: Right Occiput Anterior).  APGAR: 9, 9; weight pending.   Placenta status: Spontaneous, Intact and sent for pathology.  Cord: 3 vessels with the following complications: None.  Cord pH: n/a  Anesthesia: Epidural Episiotomy: None Lacerations: None Suture Repair:  n/a Est. Blood Loss (mL): 82  Mom to postpartum.  Baby to Couplet care / Skin to Skin.  Mitchel Honour 03/13/2023, 6:28 AM

## 2022-06-14 ENCOUNTER — Other Ambulatory Visit: Payer: Self-pay | Admitting: Obstetrics and Gynecology

## 2022-06-14 DIAGNOSIS — R109 Unspecified abdominal pain: Secondary | ICD-10-CM

## 2022-08-09 DIAGNOSIS — N911 Secondary amenorrhea: Secondary | ICD-10-CM | POA: Diagnosis not present

## 2022-08-16 DIAGNOSIS — Z31430 Encounter of female for testing for genetic disease carrier status for procreative management: Secondary | ICD-10-CM | POA: Diagnosis not present

## 2022-08-16 DIAGNOSIS — Z3685 Encounter for antenatal screening for Streptococcus B: Secondary | ICD-10-CM | POA: Diagnosis not present

## 2022-08-16 DIAGNOSIS — Z3481 Encounter for supervision of other normal pregnancy, first trimester: Secondary | ICD-10-CM | POA: Diagnosis not present

## 2022-08-16 DIAGNOSIS — Z8481 Family history of carrier of genetic disease: Secondary | ICD-10-CM | POA: Diagnosis not present

## 2022-08-16 DIAGNOSIS — Z3A08 8 weeks gestation of pregnancy: Secondary | ICD-10-CM | POA: Diagnosis not present

## 2022-08-16 LAB — OB RESULTS CONSOLE RPR: RPR: NONREACTIVE

## 2022-08-16 LAB — OB RESULTS CONSOLE HEPATITIS B SURFACE ANTIGEN: Hepatitis B Surface Ag: NEGATIVE

## 2022-08-16 LAB — OB RESULTS CONSOLE RUBELLA ANTIBODY, IGM: Rubella: NON-IMMUNE/NOT IMMUNE

## 2022-08-16 LAB — OB RESULTS CONSOLE HIV ANTIBODY (ROUTINE TESTING): HIV: NONREACTIVE

## 2022-08-16 LAB — HEPATITIS C ANTIBODY: HCV Ab: NEGATIVE

## 2022-08-23 DIAGNOSIS — O09529 Supervision of elderly multigravida, unspecified trimester: Secondary | ICD-10-CM | POA: Diagnosis not present

## 2022-08-23 DIAGNOSIS — O09511 Supervision of elderly primigravida, first trimester: Secondary | ICD-10-CM | POA: Diagnosis not present

## 2022-09-07 DIAGNOSIS — R1031 Right lower quadrant pain: Secondary | ICD-10-CM | POA: Diagnosis not present

## 2022-09-07 DIAGNOSIS — Z3A11 11 weeks gestation of pregnancy: Secondary | ICD-10-CM | POA: Diagnosis not present

## 2022-09-07 LAB — OB RESULTS CONSOLE GC/CHLAMYDIA
Chlamydia: NEGATIVE
Neisseria Gonorrhea: NEGATIVE

## 2022-09-17 DIAGNOSIS — Z3A13 13 weeks gestation of pregnancy: Secondary | ICD-10-CM | POA: Diagnosis not present

## 2022-09-17 DIAGNOSIS — O09521 Supervision of elderly multigravida, first trimester: Secondary | ICD-10-CM | POA: Diagnosis not present

## 2022-09-17 DIAGNOSIS — O209 Hemorrhage in early pregnancy, unspecified: Secondary | ICD-10-CM | POA: Diagnosis not present

## 2022-09-22 DIAGNOSIS — Z8632 Personal history of gestational diabetes: Secondary | ICD-10-CM | POA: Diagnosis not present

## 2022-09-22 DIAGNOSIS — O209 Hemorrhage in early pregnancy, unspecified: Secondary | ICD-10-CM | POA: Diagnosis not present

## 2022-09-22 DIAGNOSIS — Z3A13 13 weeks gestation of pregnancy: Secondary | ICD-10-CM | POA: Diagnosis not present

## 2022-09-22 DIAGNOSIS — O09521 Supervision of elderly multigravida, first trimester: Secondary | ICD-10-CM | POA: Diagnosis not present

## 2022-09-27 DIAGNOSIS — O99810 Abnormal glucose complicating pregnancy: Secondary | ICD-10-CM | POA: Diagnosis not present

## 2022-10-06 DIAGNOSIS — Z3A15 15 weeks gestation of pregnancy: Secondary | ICD-10-CM | POA: Diagnosis not present

## 2022-10-06 DIAGNOSIS — Z8751 Personal history of pre-term labor: Secondary | ICD-10-CM | POA: Diagnosis not present

## 2022-10-06 DIAGNOSIS — Z361 Encounter for antenatal screening for raised alphafetoprotein level: Secondary | ICD-10-CM | POA: Diagnosis not present

## 2022-10-20 DIAGNOSIS — O4692 Antepartum hemorrhage, unspecified, second trimester: Secondary | ICD-10-CM | POA: Diagnosis not present

## 2022-10-20 DIAGNOSIS — Z3A17 17 weeks gestation of pregnancy: Secondary | ICD-10-CM | POA: Diagnosis not present

## 2022-10-26 DIAGNOSIS — N898 Other specified noninflammatory disorders of vagina: Secondary | ICD-10-CM | POA: Diagnosis not present

## 2022-10-26 DIAGNOSIS — Z3A18 18 weeks gestation of pregnancy: Secondary | ICD-10-CM | POA: Diagnosis not present

## 2022-10-26 DIAGNOSIS — O26892 Other specified pregnancy related conditions, second trimester: Secondary | ICD-10-CM | POA: Diagnosis not present

## 2022-11-02 DIAGNOSIS — Z3A19 19 weeks gestation of pregnancy: Secondary | ICD-10-CM | POA: Diagnosis not present

## 2022-11-02 DIAGNOSIS — Z363 Encounter for antenatal screening for malformations: Secondary | ICD-10-CM | POA: Diagnosis not present

## 2022-11-02 DIAGNOSIS — O09522 Supervision of elderly multigravida, second trimester: Secondary | ICD-10-CM | POA: Diagnosis not present

## 2022-12-21 DIAGNOSIS — Z3480 Encounter for supervision of other normal pregnancy, unspecified trimester: Secondary | ICD-10-CM | POA: Diagnosis not present

## 2022-12-21 DIAGNOSIS — O99810 Abnormal glucose complicating pregnancy: Secondary | ICD-10-CM | POA: Diagnosis not present

## 2023-01-06 DIAGNOSIS — O24410 Gestational diabetes mellitus in pregnancy, diet controlled: Secondary | ICD-10-CM | POA: Diagnosis not present

## 2023-01-06 DIAGNOSIS — Z3A28 28 weeks gestation of pregnancy: Secondary | ICD-10-CM | POA: Diagnosis not present

## 2023-01-06 DIAGNOSIS — O09522 Supervision of elderly multigravida, second trimester: Secondary | ICD-10-CM | POA: Diagnosis not present

## 2023-01-19 DIAGNOSIS — Z713 Dietary counseling and surveillance: Secondary | ICD-10-CM | POA: Diagnosis not present

## 2023-01-19 DIAGNOSIS — Z3A30 30 weeks gestation of pregnancy: Secondary | ICD-10-CM | POA: Diagnosis not present

## 2023-01-31 DIAGNOSIS — Z3A32 32 weeks gestation of pregnancy: Secondary | ICD-10-CM | POA: Diagnosis not present

## 2023-01-31 DIAGNOSIS — O24415 Gestational diabetes mellitus in pregnancy, controlled by oral hypoglycemic drugs: Secondary | ICD-10-CM | POA: Diagnosis not present

## 2023-02-03 DIAGNOSIS — O24415 Gestational diabetes mellitus in pregnancy, controlled by oral hypoglycemic drugs: Secondary | ICD-10-CM | POA: Diagnosis not present

## 2023-02-03 DIAGNOSIS — Z3A32 32 weeks gestation of pregnancy: Secondary | ICD-10-CM | POA: Diagnosis not present

## 2023-02-18 DIAGNOSIS — Z3A35 35 weeks gestation of pregnancy: Secondary | ICD-10-CM | POA: Diagnosis not present

## 2023-02-18 DIAGNOSIS — O99891 Other specified diseases and conditions complicating pregnancy: Secondary | ICD-10-CM | POA: Diagnosis not present

## 2023-02-25 DIAGNOSIS — Z3A36 36 weeks gestation of pregnancy: Secondary | ICD-10-CM | POA: Diagnosis not present

## 2023-02-25 DIAGNOSIS — O99891 Other specified diseases and conditions complicating pregnancy: Secondary | ICD-10-CM | POA: Diagnosis not present

## 2023-03-01 DIAGNOSIS — Z3A36 36 weeks gestation of pregnancy: Secondary | ICD-10-CM | POA: Diagnosis not present

## 2023-03-01 DIAGNOSIS — O99891 Other specified diseases and conditions complicating pregnancy: Secondary | ICD-10-CM | POA: Diagnosis not present

## 2023-03-04 DIAGNOSIS — O24415 Gestational diabetes mellitus in pregnancy, controlled by oral hypoglycemic drugs: Secondary | ICD-10-CM | POA: Diagnosis not present

## 2023-03-04 DIAGNOSIS — Z3A37 37 weeks gestation of pregnancy: Secondary | ICD-10-CM | POA: Diagnosis not present

## 2023-03-11 DIAGNOSIS — Z3A38 38 weeks gestation of pregnancy: Secondary | ICD-10-CM | POA: Diagnosis not present

## 2023-03-11 DIAGNOSIS — O09523 Supervision of elderly multigravida, third trimester: Secondary | ICD-10-CM | POA: Diagnosis not present

## 2023-03-12 ENCOUNTER — Other Ambulatory Visit: Payer: Self-pay

## 2023-03-12 ENCOUNTER — Inpatient Hospital Stay (HOSPITAL_COMMUNITY): Payer: Medicaid Other

## 2023-03-12 ENCOUNTER — Inpatient Hospital Stay (HOSPITAL_COMMUNITY)
Admission: AD | Admit: 2023-03-12 | Discharge: 2023-03-15 | DRG: 807 | Disposition: A | Discharge status: Home / Self Care | Payer: Medicaid Other | Attending: Obstetrics & Gynecology | Admitting: Obstetrics & Gynecology

## 2023-03-12 ENCOUNTER — Encounter (HOSPITAL_COMMUNITY): Payer: Self-pay | Admitting: *Deleted

## 2023-03-12 ENCOUNTER — Inpatient Hospital Stay (HOSPITAL_COMMUNITY): Payer: Medicaid Other | Admitting: Anesthesiology

## 2023-03-12 DIAGNOSIS — O411290 Chorioamnionitis, unspecified trimester, not applicable or unspecified: Principal | ICD-10-CM | POA: Diagnosis present

## 2023-03-12 DIAGNOSIS — Z3A38 38 weeks gestation of pregnancy: Secondary | ICD-10-CM | POA: Diagnosis not present

## 2023-03-12 DIAGNOSIS — Z8249 Family history of ischemic heart disease and other diseases of the circulatory system: Secondary | ICD-10-CM

## 2023-03-12 DIAGNOSIS — Z833 Family history of diabetes mellitus: Secondary | ICD-10-CM | POA: Diagnosis not present

## 2023-03-12 DIAGNOSIS — Z6791 Unspecified blood type, Rh negative: Secondary | ICD-10-CM

## 2023-03-12 DIAGNOSIS — Z3689 Encounter for other specified antenatal screening: Secondary | ICD-10-CM

## 2023-03-12 DIAGNOSIS — O411230 Chorioamnionitis, third trimester, not applicable or unspecified: Secondary | ICD-10-CM | POA: Diagnosis not present

## 2023-03-12 DIAGNOSIS — Z87440 Personal history of urinary (tract) infections: Secondary | ICD-10-CM | POA: Diagnosis not present

## 2023-03-12 DIAGNOSIS — Z825 Family history of asthma and other chronic lower respiratory diseases: Secondary | ICD-10-CM | POA: Diagnosis not present

## 2023-03-12 DIAGNOSIS — O24420 Gestational diabetes mellitus in childbirth, diet controlled: Secondary | ICD-10-CM | POA: Diagnosis present

## 2023-03-12 DIAGNOSIS — Z818 Family history of other mental and behavioral disorders: Secondary | ICD-10-CM

## 2023-03-12 DIAGNOSIS — Z811 Family history of alcohol abuse and dependence: Secondary | ICD-10-CM

## 2023-03-12 DIAGNOSIS — Z87442 Personal history of urinary calculi: Secondary | ICD-10-CM | POA: Diagnosis not present

## 2023-03-12 DIAGNOSIS — O43899 Other placental disorders, unspecified trimester: Secondary | ICD-10-CM | POA: Diagnosis not present

## 2023-03-12 DIAGNOSIS — O99824 Streptococcus B carrier state complicating childbirth: Secondary | ICD-10-CM | POA: Diagnosis present

## 2023-03-12 DIAGNOSIS — Z0389 Encounter for observation for other suspected diseases and conditions ruled out: Secondary | ICD-10-CM | POA: Diagnosis not present

## 2023-03-12 DIAGNOSIS — O365990 Maternal care for other known or suspected poor fetal growth, unspecified trimester, not applicable or unspecified: Secondary | ICD-10-CM | POA: Diagnosis not present

## 2023-03-12 DIAGNOSIS — O26893 Other specified pregnancy related conditions, third trimester: Secondary | ICD-10-CM | POA: Diagnosis present

## 2023-03-12 DIAGNOSIS — Z1152 Encounter for screening for COVID-19: Secondary | ICD-10-CM

## 2023-03-12 LAB — CBC WITH DIFFERENTIAL/PLATELET
Abs Immature Granulocytes: 0.07 10*3/uL (ref 0.00–0.07)
Basophils Absolute: 0 10*3/uL (ref 0.0–0.1)
Basophils Relative: 0 %
Eosinophils Absolute: 0 10*3/uL (ref 0.0–0.5)
Eosinophils Relative: 0 %
HCT: 38 % (ref 36.0–46.0)
Hemoglobin: 13.3 g/dL (ref 12.0–15.0)
Immature Granulocytes: 1 %
Lymphocytes Relative: 5 %
Lymphs Abs: 0.7 10*3/uL (ref 0.7–4.0)
MCH: 33.2 pg (ref 26.0–34.0)
MCHC: 35 g/dL (ref 30.0–36.0)
MCV: 94.8 fL (ref 80.0–100.0)
Monocytes Absolute: 0.7 10*3/uL (ref 0.1–1.0)
Monocytes Relative: 5 %
Neutro Abs: 13.2 10*3/uL — ABNORMAL HIGH (ref 1.7–7.7)
Neutrophils Relative %: 89 %
Platelets: 156 10*3/uL (ref 150–400)
RBC: 4.01 MIL/uL (ref 3.87–5.11)
RDW: 14 % (ref 11.5–15.5)
WBC: 14.8 10*3/uL — ABNORMAL HIGH (ref 4.0–10.5)
nRBC: 0 % (ref 0.0–0.2)

## 2023-03-12 LAB — COMPREHENSIVE METABOLIC PANEL
ALT: 17 U/L (ref 0–44)
AST: 20 U/L (ref 15–41)
Albumin: 2.8 g/dL — ABNORMAL LOW (ref 3.5–5.0)
Alkaline Phosphatase: 98 U/L (ref 38–126)
Anion gap: 11 (ref 5–15)
BUN: 5 mg/dL — ABNORMAL LOW (ref 6–20)
CO2: 18 mmol/L — ABNORMAL LOW (ref 22–32)
Calcium: 8.8 mg/dL — ABNORMAL LOW (ref 8.9–10.3)
Chloride: 103 mmol/L (ref 98–111)
Creatinine, Ser: 0.95 mg/dL (ref 0.44–1.00)
GFR, Estimated: >60 mL/min (ref >60)
Glucose, Bld: 87 mg/dL (ref 70–99)
Potassium: 3.2 mmol/L — ABNORMAL LOW (ref 3.5–5.1)
Sodium: 132 mmol/L — ABNORMAL LOW (ref 135–145)
Total Bilirubin: 0.5 mg/dL (ref 0.3–1.2)
Total Protein: 6.2 g/dL — ABNORMAL LOW (ref 6.5–8.1)

## 2023-03-12 LAB — URINALYSIS, ROUTINE W REFLEX MICROSCOPIC
Bacteria, UA: NONE SEEN
Bilirubin Urine: NEGATIVE
Glucose, UA: NEGATIVE mg/dL
Ketones, ur: 20 mg/dL — AB
Leukocytes,Ua: NEGATIVE
Nitrite: NEGATIVE
Protein, ur: NEGATIVE mg/dL
Specific Gravity, Urine: 1.01 (ref 1.005–1.030)
pH: 6 (ref 5.0–8.0)

## 2023-03-12 LAB — WET PREP, GENITAL
Clue Cells Wet Prep HPF POC: NONE SEEN
Sperm: NONE SEEN
Trich, Wet Prep: NONE SEEN
WBC, Wet Prep HPF POC: >=10 — AB (ref <10)
Yeast Wet Prep HPF POC: NONE SEEN

## 2023-03-12 LAB — GLUCOSE, CAPILLARY
Glucose-Capillary: 113 mg/dL — ABNORMAL HIGH (ref 70–99)
Glucose-Capillary: 126 mg/dL — ABNORMAL HIGH (ref 70–99)

## 2023-03-12 LAB — TYPE AND SCREEN
ABO/RH(D): O NEG
Antibody Screen: NEGATIVE

## 2023-03-12 LAB — RESP PANEL BY RT-PCR (RSV, FLU A&B, COVID)  RVPGX2
Influenza A by PCR: NEGATIVE
Influenza B by PCR: NEGATIVE
Resp Syncytial Virus by PCR: NEGATIVE
SARS Coronavirus 2 by RT PCR: NEGATIVE

## 2023-03-12 LAB — LACTIC ACID, PLASMA: Lactic Acid, Venous: 1.5 mmol/L (ref 0.5–1.9)

## 2023-03-12 LAB — RUPTURE OF MEMBRANE (ROM)PLUS: Rom Plus: NEGATIVE

## 2023-03-12 MED ORDER — LACTATED RINGERS IV SOLN
500.0000 mL | Freq: Once | INTRAVENOUS | Status: AC
  Administered 2023-03-12: 500 mL via INTRAVENOUS

## 2023-03-12 MED ORDER — LACTATED RINGERS IV SOLN: 500.0000 mL | INTRAVENOUS | Status: DC | PRN

## 2023-03-12 MED ORDER — BUTALBITAL-APAP-CAFFEINE 50-325-40 MG PO TABS
1.0000 | ORAL_TABLET | Freq: Four times a day (QID) | ORAL | Status: DC | PRN
  Administered 2023-03-12 – 2023-03-13 (×2): 1 via ORAL
  Filled 2023-03-12 (×2): qty 1

## 2023-03-12 MED ORDER — DIPHENHYDRAMINE HCL 50 MG/ML IJ SOLN: 12.5000 mg | INTRAMUSCULAR | Status: DC | PRN

## 2023-03-12 MED ORDER — OXYTOCIN-SODIUM CHLORIDE 30-0.9 UT/500ML-% IV SOLN
2.5000 [IU]/h | INTRAVENOUS | Status: DC
  Filled 2023-03-12: qty 500

## 2023-03-12 MED ORDER — PIPERACILLIN-TAZOBACTAM 3.375 G IVPB 30 MIN: 3.3750 g | Freq: Four times a day (QID) | INTRAVENOUS | Status: DC

## 2023-03-12 MED ORDER — LACTATED RINGERS IV SOLN: INTRAVENOUS | Status: DC

## 2023-03-12 MED ORDER — FENTANYL-BUPIVACAINE-NACL 0.5-0.125-0.9 MG/250ML-% EP SOLN
12.0000 mL/h | EPIDURAL | Status: DC | PRN
  Administered 2023-03-12: 12 mL/h via EPIDURAL
  Filled 2023-03-12: qty 250

## 2023-03-12 MED ORDER — ACETAMINOPHEN 325 MG PO TABS
650.0000 mg | ORAL_TABLET | Freq: Four times a day (QID) | ORAL | Status: DC | PRN
  Administered 2023-03-12: 650 mg via ORAL
  Filled 2023-03-12: qty 2

## 2023-03-12 MED ORDER — PHENYLEPHRINE 80 MCG/ML (10ML) SYRINGE FOR IV PUSH (FOR BLOOD PRESSURE SUPPORT): 80.0000 ug | PREFILLED_SYRINGE | INTRAVENOUS | Status: DC | PRN

## 2023-03-12 MED ORDER — OXYTOCIN BOLUS FROM INFUSION
333.0000 mL | Freq: Once | INTRAVENOUS | Status: AC
  Administered 2023-03-13: 333 mL via INTRAVENOUS

## 2023-03-12 MED ORDER — SOD CITRATE-CITRIC ACID 500-334 MG/5ML PO SOLN: 30.0000 mL | ORAL | Status: DC | PRN

## 2023-03-12 MED ORDER — FENTANYL CITRATE (PF) 100 MCG/2ML IJ SOLN: 50.0000 ug | INTRAMUSCULAR | Status: DC | PRN

## 2023-03-12 MED ORDER — LACTATED RINGERS IV BOLUS
3000.0000 mL | Freq: Once | INTRAVENOUS | Status: AC
  Administered 2023-03-12 (×2): 1000 mL via INTRAVENOUS
  Administered 2023-03-12: 3000 mL via INTRAVENOUS

## 2023-03-12 MED ORDER — LIDOCAINE HCL (PF) 1 % IJ SOLN: 30.0000 mL | INTRAMUSCULAR | Status: DC | PRN

## 2023-03-12 MED ORDER — PIPERACILLIN-TAZOBACTAM 3.375 G IVPB
3.3750 g | Freq: Three times a day (TID) | INTRAVENOUS | Status: DC
  Administered 2023-03-12 – 2023-03-13 (×2): 3.375 g via INTRAVENOUS
  Filled 2023-03-12 (×4): qty 50

## 2023-03-12 MED ORDER — EPHEDRINE 5 MG/ML INJ: 10.0000 mg | INTRAVENOUS | Status: DC | PRN

## 2023-03-12 MED ORDER — OXYCODONE-ACETAMINOPHEN 5-325 MG PO TABS: 2.0000 | ORAL_TABLET | ORAL | Status: DC | PRN

## 2023-03-12 MED ORDER — ONDANSETRON HCL 4 MG/2ML IJ SOLN: 4.0000 mg | Freq: Four times a day (QID) | INTRAMUSCULAR | Status: DC | PRN

## 2023-03-12 MED ORDER — ACETAMINOPHEN 325 MG PO TABS: 650.0000 mg | ORAL_TABLET | ORAL | Status: DC | PRN

## 2023-03-12 MED ORDER — LIDOCAINE HCL (PF) 1 % IJ SOLN
INTRAMUSCULAR | Status: DC | PRN
  Administered 2023-03-12 (×2): 4 mL via EPIDURAL

## 2023-03-12 MED ORDER — PHENYLEPHRINE 80 MCG/ML (10ML) SYRINGE FOR IV PUSH (FOR BLOOD PRESSURE SUPPORT)
80.0000 ug | PREFILLED_SYRINGE | INTRAVENOUS | Status: DC | PRN
  Filled 2023-03-12: qty 10

## 2023-03-12 MED ORDER — OXYCODONE-ACETAMINOPHEN 5-325 MG PO TABS: 1.0000 | ORAL_TABLET | ORAL | Status: DC | PRN

## 2023-03-12 NOTE — MAU Provider Note (Addendum)
Fever, Contractions    S Ms. Lavana Huckeba is a 37 y.o. [phone] pregnant female at [redacted]w[redacted]d who presents to MAU today with complaint of contractions in s/o acute onset fever. Pt states that she awoke this morning with chills and whole body aches.  Though given her GDMA2 hx that maybe it was hypoglycemia, CBG at home 65.  However, didn't relieve s/p treatment of hypoglycemia with food.  So checked her temperature and reports 102.85F at 1330.  Took tylenol @ 1400.  Wasn't improving so reports here for evaluation.  She denies sick contacts, respiratory / urinary symptoms.  However, she does report constipation and new acute, sharp RLQ abdominal pain as well.  Has appendix.  Denies N/V, change in stools/urination.  On chart review, pt has GBS bacteruria and was seen 10/1 for possible leaking of fluids but Nitrazine, pooling and cough test negative.    Receives care at [facility] for [facility]. Prenatal records reviewed.  Pertinent items noted in HPI and remainder of comprehensive ROS otherwise negative.   O BP 119/80 (BP Location: Right Arm)   Pulse (!) 115   Temp 99.1 F (37.3 C) (Oral)   Resp 18   Ht 5\' 4"  (1.626 m)   Wt 80.9 kg   SpO2 99%   BMI 30.61 kg/m  Physical Exam Constitutional:      General: She is not in acute distress.    Appearance: She is normal weight. She is ill-appearing.  HENT:     Head: Normocephalic and atraumatic.     Right Ear: External ear normal.     Left Ear: External ear normal.     Nose: Nose normal.     Mouth/Throat:     Mouth: Mucous membranes are dry.     Pharynx: Oropharynx is clear.  Eyes:     Conjunctiva/sclera: Conjunctivae normal.  Cardiovascular:     Rate and Rhythm: Tachycardia present.     Heart sounds: No murmur heard.    No friction rub. No gallop.  Pulmonary:     Effort: Pulmonary effort is normal. No respiratory distress.     Breath sounds: Normal breath sounds.  Abdominal:     Tenderness: There is abdominal tenderness.      Comments: RLQ TTP and positive Rovsing sign, no guarding or peritoneal signs  Genitourinary:    General: Normal vulva.     Comments: Dilated external os with purulent discharge, no pooling or VB noted  Musculoskeletal:        General: No swelling. Normal range of motion.     Cervical back: Normal range of motion.  Skin:    General: Skin is warm and dry.     Findings: No erythema or rash.  Neurological:     General: No focal deficit present.     Mental Status: She is alert and oriented to person, place, and time. Mental status is at baseline.     Motor: No weakness.  Psychiatric:        Mood and Affect: Mood normal.        Behavior: Behavior normal.        Thought Content: Thought content normal.        Judgment: Judgment normal.      MDM: High MAU Course:  Sepsis R/O - CBCdiff = 14.8 with left shift  - CMP = K 3.2, Cr 0.95*, normal LFTs  - Quad respiratory panel negative  - Lactate 1.5 - Blood cultures collected  - UA noninfectious   Wet  Prep = negative  ROM plus = negative  GC collected   HDS outside of tachycardia, febrile so meeting SIRS criteria.  FHR also tachycardic so suspicious for chorio given ?rupture 10/1 despite negative screening x 3 vs appendicitis given RLQ pain on physical exam.    A&P: #[redacted] weeks gestation #Sepsis, most likely chorioamnionitis vs appendicitis   Signed out to oncoming provider pending [place] prelim read to r/o appendicitis for Dr. Langston Masker to make action plan.   Hessie Dibble, MD 03/12/2023 4:57 PM   Reassessment (8:36 PM) -[place] report as above. -Dr. Fonnie Birkenhead called and is aware of findings.  Reports she is en route to unit to assess patient. -Care relinquished.  Cherre Robins MSN, CNM Advanced Practice Provider, [facility]

## 2023-03-12 NOTE — Anesthesia Procedure Notes (Signed)
Epidural Patient location during procedure: OB Start time: 03/12/2023 11:17 PM End time: 03/12/2023 11:23 PM  Staffing Anesthesiologist: Kaylyn Layer, MD Performed: anesthesiologist   Preanesthetic Checklist Completed: patient identified, IV checked, risks and benefits discussed, monitors and equipment checked, pre-op evaluation and timeout performed  Epidural Patient position: sitting Prep: DuraPrep and site prepped and draped Patient monitoring: continuous pulse ox, blood pressure and heart rate Approach: midline Location: L3-L4 Injection technique: LOR air  Needle:  Needle type: Tuohy  Needle gauge: 17 G Needle length: 9 cm Needle insertion depth: 5 cm Catheter type: closed end flexible Catheter size: 19 Gauge Catheter at skin depth: 10 cm Test dose: negative and Other (1% lidocaine)  Assessment Events: blood not aspirated, no cerebrospinal fluid, injection not painful, no injection resistance, no paresthesia and negative IV test  Additional Notes Patient identified. Risks, benefits, and alternatives discussed with patient including but not limited to bleeding, infection, nerve damage, paralysis, failed block, incomplete pain control, headache, blood pressure changes, nausea, vomiting, reactions to medication, itching, and postpartum back pain. Confirmed with bedside nurse the patient's most recent platelet count. Confirmed with patient that they are not currently taking any anticoagulation, have any bleeding history, or any family history of bleeding disorders. Patient expressed understanding and wished to proceed. All questions were answered. Sterile technique was used throughout the entire procedure. Please see nursing notes for vital signs.   Crisp LOR after one needle redirection, however catheter very difficult to hand bolus after placement. Catheter removed and replaced at same location, easy to bolus. Test dose was given through epidural catheter and negative prior to  continuing to dose epidural or start infusion. Warning signs of high block given to the patient including shortness of breath, tingling/numbness in hands, complete motor block, or any concerning symptoms with instructions to call for help. Patient was given instructions on fall risk and not to get out of bed. All questions and concerns addressed with instructions to call with any issues or inadequate analgesia.  Reason for block:procedure for pain

## 2023-03-12 NOTE — Plan of Care (Signed)

## 2023-03-12 NOTE — MAU Note (Signed)
Natalie Brennan is a 37 y.o. at Unknown here in MAU reporting: she has irregular ctxs, fever, HA, and body aches.  Reports highest temp was 102.4 at 1330 this afternoon, took Tylenol @ 1400.  Denies taking ay home Covid test. Denies LOF or VB.  Endorses +FM, "not as much." LMP: NA Onset of complaint: today Pain score: 7 Vitals:   03/12/23 1641  BP: 119/80  Pulse: (!) 115  Resp: 18  Temp: 99.1 F (37.3 C)  SpO2: 99%     DGU:[id number] secondary maternal apparel Lab orders placed from triage:   None

## 2023-03-12 NOTE — Anesthesia Preprocedure Evaluation (Signed)
Anesthesia Evaluation  Patient identified by MRN, date of birth, ID band Patient awake    Reviewed: Allergy & Precautions, Patient's Chart, lab work & pertinent test results  History of Anesthesia Complications Negative for: history of anesthetic complications  Airway Mallampati: II  TM Distance: >3 FB Neck ROM: Full    Dental no notable dental hx.    Pulmonary neg pulmonary ROS   Pulmonary exam normal        Cardiovascular negative cardio ROS Normal cardiovascular exam     Neuro/Psych  Headaches  Anxiety     scoliosis    GI/Hepatic negative GI ROS, Neg liver ROS,,,  Endo/Other  negative endocrine ROS    Renal/GU   negative genitourinary   Musculoskeletal negative musculoskeletal ROS (+)    Abdominal   Peds  Hematology negative hematology ROS (+)   Anesthesia Other Findings Day of surgery medications reviewed with patient.  Reproductive/Obstetrics (+) Pregnancy (chorio)                             Anesthesia Physical Anesthesia Plan  ASA: 3  Anesthesia Plan: Epidural   Post-op Pain Management:    Induction:   PONV Risk Score and Plan: Treatment may vary due to age or medical condition  Airway Management Planned: Natural Airway  Additional Equipment: Fetal Monitoring  Intra-op Plan:   Post-operative Plan:   Informed Consent: I have reviewed the patients History and Physical, chart, labs and discussed the procedure including the risks, benefits and alternatives for the proposed anesthesia with the patient or authorized representative who has indicated his/her understanding and acceptance.       Plan Discussed with:   Anesthesia Plan Comments:        Anesthesia Quick Evaluation

## 2023-03-12 NOTE — H&P (Signed)
Natalie Brennan is a 37 y.o. female [phone] at [redacted]w[redacted]d presenting to MAU with c/o fever and body aches.  Patient reports Tmax 102.4 at 1330.  Patient was tachycardic on arrival to MAU as was the fetus.  Per MAU provider, pain was worse in RLQ.  Abdominal u/s was done to attempt to r/o appendicitis but the appendix was non-visualized.  WBC 14.8 with left shift.  Lactic acid 1.5 (wnl). Respiratory panel negative.  UA without evidence for UTI.  Blood cultures were obtained prior to starting Zosyn.  With IVF, fetal tachycardia resolved with Cat I tracing.   Antepartum course complicated by A2DM on glyburide 2.5 at bedtime.  Last u/s 9/27 with EFW 6#8 (41%).  Patient has seasonal anxiety and has not taken medication during this pregnancy.  GBS positive.  AMA with low risk NIPS.  Rh negative and FOB Rh negative.  OB History     Gravida  5   Para  3   Term  2   Preterm  1   AB  1   Living  3      SAB  1   IAB      Ectopic      Multiple  0   Live Births  3          Past Medical History:  Diagnosis Date   Anxiety    Chronic constipation    Endometriosis    Frequent headaches    History of fatigue    History of gastritis 2018   History of gestational diabetes    History of metrorrhagia    IBS (irritable bowel syndrome)    Interstitial cystitis    Urologist dx in remote past--pt questions the accuracy of this.   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 Anne Fu).   EYE SURGERY     Lasik -bilateral eyes   LAPAROSCOPY N/A 09/04/2012   Procedure:  LAPAROSCOPY OPERATIVE;  Surgeon: Juluis Mire, MD;  Location: [facility];  Service: Gynecology;  Laterality: N/A;   WISDOM TOOTH EXTRACTION     Family History: family history includes Alcohol abuse in her father; Asthma in her daughter; Cancer in her maternal grandmother; Cirrhosis in her father; Depression in her mother; Diabetes in her maternal grandmother and paternal grandmother; Drug abuse in her brother and sister; Early death in her father; Heart attack in her paternal uncle; Heart disease in her paternal grandfather. Social History:  reports that she has never smoked. She has never used smokeless tobacco. She reports that she does not drink alcohol and does not use drugs.     Maternal Diabetes: Yes:  Diabetes Type:  Insulin/Medication controlled Genetic Screening: Normal Maternal Ultrasounds/Referrals: Normal Fetal Ultrasounds or other Referrals:  None Maternal Substance Abuse:  No Significant Maternal Medications:  Meds include: Other: Glyburide Significant Maternal Lab Results:  Group B Strep positive and Rh negative Number of Prenatal Visits:greater than 3 verified prenatal visits Maternal Vaccinations:TDap Other Comments:  None  Review of Systems Maternal Medical History:  Fetal activity: Perceived fetal activity is  normal.   Last perceived fetal movement was within the past hour.   Prenatal Complications - Diabetes: gestational. Diabetes is managed by oral agent (monotherapy).     Dilation: 4.5 Effacement (%): 70 Station: -2 Exam by:: K.WIlson,RN Blood pressure 129/80, pulse (!) 118, temperature 99.5 F (37.5 C), temperature source Axillary, resp. rate 18, height 5\' 4"  (1.626 m), weight 80.9 kg, SpO2 99%, unknown if currently breastfeeding. Maternal Exam:  Uterine Assessment: Contraction strength is mild.  Contraction frequency is irregular.  Abdomen: Patient reports generalized tenderness.  Fundal height is c/w dates.   Estimated fetal weight is 7#4.     Fetal  Exam Fetal Monitor Review: Baseline rate: 150.  Variability: moderate (6-25 bpm).   Pattern: accelerations present and no decelerations.   Fetal State Assessment: Category I - tracings are normal.   Physical Exam Abdominal:     Tenderness: There is generalized abdominal tenderness.   No guarding or rebound.  Uterine tenderness diffusely.   Prenatal labs: ABO, Rh:  O neg Antibody:  Negative Rubella:  Immune RPR:   NR HBsAg:   Negative HIV:   NR GBS:   Positive  Assessment/Plan: 37yo [id number] at [redacted]w[redacted]d with suspected chorioamnionitis -Admit to L&D -Patient prefers AROM rather than pitocin -CLEA when desired -Continue Zosyn -Anticipate NSVD -A2DM-CBG q 2h   Mitchel Honour 03/12/2023, 8:56 PM

## 2023-03-13 ENCOUNTER — Encounter (HOSPITAL_COMMUNITY): Payer: Self-pay | Admitting: Obstetrics & Gynecology

## 2023-03-13 LAB — GLUCOSE, CAPILLARY
Glucose-Capillary: 117 mg/dL — ABNORMAL HIGH (ref 70–99)
Glucose-Capillary: 100 mg/dL — ABNORMAL HIGH (ref 70–99)

## 2023-03-13 LAB — RPR
RPR Ser Ql: REACTIVE — AB
RPR Titer: 1:1 {titer}

## 2023-03-13 MED ORDER — ONDANSETRON HCL 4 MG PO TABS: 4.0000 mg | ORAL_TABLET | ORAL | Status: DC | PRN

## 2023-03-13 MED ORDER — OXYCODONE HCL 5 MG PO TABS: 5.0000 mg | ORAL_TABLET | ORAL | Status: DC | PRN

## 2023-03-13 MED ORDER — TERBUTALINE SULFATE 1 MG/ML IJ SOLN: 0.2500 mg | Freq: Once | INTRAMUSCULAR | Status: DC | PRN

## 2023-03-13 MED ORDER — COCONUT OIL OIL: 1.0000 | TOPICAL_OIL | Status: DC | PRN

## 2023-03-13 MED ORDER — DIPHENHYDRAMINE HCL 25 MG PO CAPS: 25.0000 mg | ORAL_CAPSULE | Freq: Four times a day (QID) | ORAL | Status: DC | PRN

## 2023-03-13 MED ORDER — DIBUCAINE (PERIANAL) 1 % EX OINT: 1.0000 | TOPICAL_OINTMENT | CUTANEOUS | Status: DC | PRN

## 2023-03-13 MED ORDER — ZOLPIDEM TARTRATE 5 MG PO TABS: 5.0000 mg | ORAL_TABLET | Freq: Every evening | ORAL | Status: DC | PRN

## 2023-03-13 MED ORDER — ONDANSETRON HCL 4 MG/2ML IJ SOLN: 4.0000 mg | INTRAMUSCULAR | Status: DC | PRN

## 2023-03-13 MED ORDER — BENZOCAINE-MENTHOL 20-0.5 % EX AERO
1.0000 | INHALATION_SPRAY | CUTANEOUS | Status: DC | PRN
  Administered 2023-03-13: 1 via TOPICAL
  Filled 2023-03-13: qty 56

## 2023-03-13 MED ORDER — ACETAMINOPHEN 325 MG PO TABS: 650.0000 mg | ORAL_TABLET | ORAL | Status: DC | PRN

## 2023-03-13 MED ORDER — OXYTOCIN-SODIUM CHLORIDE 30-0.9 UT/500ML-% IV SOLN
1.0000 m[IU]/min | INTRAVENOUS | Status: DC
  Administered 2023-03-13: 2 m[IU]/min via INTRAVENOUS

## 2023-03-13 MED ORDER — SENNOSIDES-DOCUSATE SODIUM 8.6-50 MG PO TABS
2.0000 | ORAL_TABLET | Freq: Every day | ORAL | Status: DC
  Administered 2023-03-13 – 2023-03-15 (×3): 2 via ORAL
  Filled 2023-03-13 (×3): qty 2

## 2023-03-13 MED ORDER — TETANUS-DIPHTH-ACELL PERTUSSIS 5-2.5-18.5 LF-MCG/0.5 IM SUSY: 0.5000 mL | PREFILLED_SYRINGE | Freq: Once | INTRAMUSCULAR | Status: DC

## 2023-03-13 MED ORDER — IBUPROFEN 600 MG PO TABS
600.0000 mg | ORAL_TABLET | Freq: Four times a day (QID) | ORAL | Status: DC
  Administered 2023-03-13 – 2023-03-15 (×9): 600 mg via ORAL
  Filled 2023-03-13 (×9): qty 1

## 2023-03-13 MED ORDER — OXYCODONE HCL 5 MG PO TABS
10.0000 mg | ORAL_TABLET | ORAL | Status: DC | PRN
  Administered 2023-03-13 – 2023-03-15 (×8): 10 mg via ORAL
  Filled 2023-03-13 (×8): qty 2

## 2023-03-13 MED ORDER — PRENATAL MULTIVITAMIN CH
1.0000 | ORAL_TABLET | Freq: Every day | ORAL | Status: DC
  Administered 2023-03-15: 1 via ORAL
  Filled 2023-03-13 (×3): qty 1

## 2023-03-13 MED ORDER — WITCH HAZEL-GLYCERIN EX PADS: 1.0000 | MEDICATED_PAD | CUTANEOUS | Status: DC | PRN

## 2023-03-13 MED ORDER — SIMETHICONE 80 MG PO CHEW: 80.0000 mg | CHEWABLE_TABLET | ORAL | Status: DC | PRN

## 2023-03-13 NOTE — Anesthesia Postprocedure Evaluation (Signed)
Anesthesia Post Note  Patient: Natalie Brennan  Procedure(s) Performed: AN AD HOC LABOR EPIDURAL     Patient location during evaluation: Mother Baby Anesthesia Type: Epidural Level of consciousness: awake Pain management: satisfactory to patient Vital Signs Assessment: post-procedure vital signs reviewed and stable Respiratory status: spontaneous breathing Cardiovascular status: stable Anesthetic complications: no  No notable events documented.  Last Vitals:  Vitals:   03/13/23 0930 03/13/23 1332  BP: 115/79 118/78  Pulse: 88 89  Resp: 18 18  Temp: 36.9 C 36.9 C  SpO2: 100% 100%    Last Pain:  Vitals:   03/13/23 1332  TempSrc: Oral  PainSc: 3    Pain Goal:                   [employer]

## 2023-03-13 NOTE — Progress Notes (Signed)
RPR on admission reactive (1:1) FTABS pending  Mitchel Honour, DO

## 2023-03-13 NOTE — Progress Notes (Signed)
Comfortable with CLEA SVE 4/50/-2, AROM, clear Declines pitocin at this time FHT Cat I  Mitchel Honour, DO

## 2023-03-13 NOTE — Lactation Note (Signed)
This note was copied from a baby's chart. Lactation Consultation Note  Patient Name: Natalie Brennan [id number] Date: 03/13/2023 Age:39 hours Reason for consult: Maternal endocrine disorder;Initial assessment;Early term 44-38.6wks  Visited with experienced breastfeeding parent for initial consult. Mom reports she breastfed all 3 of her older children for over a year. This 4th baby is her biggest baby and the first time she had GDM. She keeps putting him to the breast but he falls asleep. Infant had 1 low blood sugar (36). LC assisted with latch (see LATCH score). If next blood sugar is low, mom is open to hand expressing and spoon feeding colostrum.  Feeding Plan 1) Feed baby skin to skin every 2-3 hours using breast compressions to encourage simulation. 2) Call RN/LC for breastfeeding assistance.  Maternal Data Has patient been taught Hand Expression?: Yes Does the patient have breastfeeding experience prior to this delivery?: Yes How long did the patient breastfeed?: Breastfed 3 kids for over a year  Feeding Mother's Current Feeding Choice: Breast Milk  LATCH Score Latch: Repeated attempts needed to sustain latch, nipple held in mouth throughout feeding, stimulation needed to elicit sucking reflex.  Audible Swallowing: A few with stimulation  Type of Nipple: Everted at rest and after stimulation  Comfort (Breast/Nipple): Soft / non-tender  Hold (Positioning): No assistance needed to correctly position infant at breast.  LATCH Score: 8   Lactation Tools Discussed/Used    Interventions Interventions: Breast feeding basics reviewed;Assisted with latch;Skin to skin;Breast massage;Hand express;Breast compression;Adjust position;Support pillows;Position options;Education;LC Services brochure  Discharge Pump: DEBP;Personal  Consult Status Consult Status: Follow-up Date: 03/14/23 Follow-up type: In-patient    Antionette Char 03/13/2023, 12:06 PM

## 2023-03-14 LAB — CBC
HCT: 32.3 % — ABNORMAL LOW (ref 36.0–46.0)
Hemoglobin: 10.9 g/dL — ABNORMAL LOW (ref 12.0–15.0)
MCH: 31.8 pg (ref 26.0–34.0)
MCHC: 33.7 g/dL (ref 30.0–36.0)
MCV: 94.2 fL (ref 80.0–100.0)
Platelets: 136 10*3/uL — ABNORMAL LOW (ref 150–400)
RBC: 3.43 MIL/uL — ABNORMAL LOW (ref 3.87–5.11)
RDW: 14.3 % (ref 11.5–15.5)
WBC: 7.7 10*3/uL (ref 4.0–10.5)
nRBC: 0 % (ref 0.0–0.2)

## 2023-03-14 LAB — GC/CHLAMYDIA PROBE AMP (~~LOC~~) NOT AT ARMC
Chlamydia: NEGATIVE
Comment: NEGATIVE
Comment: NORMAL
Neisseria Gonorrhea: NEGATIVE

## 2023-03-14 LAB — T.PALLIDUM AB, TOTAL: T Pallidum Abs: NONREACTIVE

## 2023-03-14 NOTE — Lactation Note (Signed)
This note was copied from a baby's chart. Lactation Consultation Note  Patient Name: Natalie Brennan [id number] Date: 03/14/2023 Age:39 hours Reason for consult: Early term 39wks;Follow-up assessment.   P39,ETI female infant, LC entered the room, infant was cuing to BF, MOB latched infant with pillow support used cradle hold position, swallows were heard, infant BF for 11 minutes. Infant does have cysts under tongue but not interfering with his latch. Birth Parent desire be set up with DEBP due infant being scheduled for surgery 03/16/2023 will have Pre-OP prior to surgery in NICU. LC set Birth Parent up with DEBP, she was expressing colostrum in breast flanges. Birth Parent knows that her EBM is safe for 4 hours at room temperature. MOB will continue to BF infant by cues, on demand, every 2-3 hours, skin to skin  and give infant back any EBM that is pumped by spoon. LC discussed continue to have maternal rest, diet and hydration.   Current feeding plans: 1- Continue to BF by cues, on demand, every 2-3 hours, skin to skin. 2- Ask for latch assistance if needed. 3- Continue to use DEBP and give infant back EBM by spoon.  Maternal Data    Feeding Mother's Current Feeding Choice: Breast Milk  LATCH Score Latch: Grasps breast easily, tongue down, lips flanged, rhythmical sucking.  Audible Swallowing: Spontaneous and intermittent  Type of Nipple: Everted at rest and after stimulation  Comfort (Breast/Nipple): Soft / non-tender  Hold (Positioning): Assistance needed to correctly position infant at breast and maintain latch.  LATCH Score: 9   Lactation Tools Discussed/Used Tools: Pump Breast pump type: Double-Electric Breast Pump Pump Education: Setup, frequency, and cleaning;Milk Storage Reason for Pumping: Infant will be going NICU for Pre-OP then surgery on 03/16/23 Pumping frequency: Birth Parent will continue to use DEBP every 3 hours for 15 minutes. Pumped volume: 2  mL  Interventions    Discharge Pump: DEBP;Personal  Consult Status Consult Status: Follow-up Date: 03/15/23 Follow-up type: In-patient    Frederico Hamman 03/14/2023, 10:29 PM

## 2023-03-14 NOTE — Discharge Summary (Signed)
Postpartum Discharge Summary  Date of Service March 14, 2023     Patient Name: Natalie Brennan DOB: 1985-11-08 MRN: [id number]  Date of admission: 03/12/2023 Delivery date:03/13/2023 Delivering provider: Mitchel Honour Date of discharge: 03/14/2023  Admitting diagnosis: Chorioamnionitis [O41.1290] Intrauterine pregnancy: [redacted]w[redacted]d     Secondary diagnosis:  Principal Problem:   Chorioamnionitis  Additional problems: none    Discharge diagnosis: Term Pregnancy Delivered                                              Post partum procedures: none Augmentation: AROM Complications: Intrauterine Inflammation or infection (Chorioamniotis)  Hospital course: Onset of Labor With Vaginal Delivery      37 y.o. yo [id number] at [redacted]w[redacted]d was admitted in Latent Labor on 03/12/2023. Labor course was complicated by chorioamnionitis  Membrane Rupture Time/Date: 2:01 AM,03/13/2023  Delivery Method:Vaginal, Spontaneous Operative Delivery:N/A Episiotomy: None Lacerations:  None Patient had a postpartum course complicated by nothing.  She is ambulating, tolerating a regular diet, passing flatus, and urinating well. Patient is discharged home in stable condition on 03/14/23.  Newborn Data: Birth date:03/13/2023 Birth time:6:11 AM Gender:Female Living status:Living Apgars:9 ,9  Weight:3880 g  Magnesium Sulfate received: No BMZ received: No Rhophylac:N/A MMR:Yes T-DaP:Given prenatally Flu: Yes RSV Vaccine received: No Transfusion:No Immunizations administered: There is no immunization history for the selected administration types on file for this patient.  Physical exam  Vitals:   03/13/23 1332 03/13/23 1636 03/13/23 2013 03/14/23 0458  BP: 118/78 110/76 115/75 108/69  Pulse: 89 81 79 64  Resp: 18 18 18 18   Temp: 98.5 F (36.9 C) 98.6 F (37 C) 97.6 F (36.4 C) 97.8 F (36.6 C)  TempSrc: Oral Oral Oral Oral  SpO2: 100% 100% 100% 98%  Weight:      Height:       General: alert, cooperative,  and no distress Lochia: appropriate Uterine Fundus: firm Incision: Healing well with no significant drainage DVT Evaluation: No evidence of DVT seen on physical exam. Labs: Lab Results  Component Value Date   WBC 7.7 03/14/2023   HGB 10.9 (L) 03/14/2023   HCT 32.3 (L) 03/14/2023   MCV 94.2 03/14/2023   PLT 136 (L) 03/14/2023      Latest Ref Rng & Units 03/12/2023    6:01 PM  CMP  Glucose 70 - 99 mg/dL 87   BUN 6 - 20 mg/dL 5   Creatinine 1.47 - 8.29 mg/dL 5.62   Sodium 130 - 865 mmol/L 132   Potassium 3.5 - 5.1 mmol/L 3.2   Chloride 98 - 111 mmol/L 103   CO2 22 - 32 mmol/L 18   Calcium 8.9 - 10.3 mg/dL 8.8   Total Protein 6.5 - 8.1 g/dL 6.2   Total Bilirubin 0.3 - 1.2 mg/dL 0.5   Alkaline Phos 38 - 126 U/L 98   AST 15 - 41 U/L 20   ALT 0 - 44 U/L 17    Edinburgh Score:    08/16/2018    1:00 PM  Edinburgh Postnatal Depression Scale Screening Tool  I have been able to laugh and see the funny side of things. 0  I have looked forward with enjoyment to things. 0  I have blamed myself unnecessarily when things went wrong. 1  I have been anxious or worried for no good reason. 1  I have felt scared  or panicky for no good reason. 1  Things have been getting on top of me. 0  I have been so unhappy that I have had difficulty sleeping. 0  I have felt sad or miserable. 0  I have been so unhappy that I have been crying. 0  The thought of harming myself has occurred to me. 0  Edinburgh Postnatal Depression Scale Total 3      After visit meds:  Allergies as of 03/14/2023   No Known Allergies      Medication List     STOP taking these medications    acetaminophen 500 MG tablet Commonly known as: TYLENOL   ALPRAZolam 0.5 MG tablet Commonly known as: XANAX   ascorbic acid 500 MG tablet Commonly known as: VITAMIN C   Cholecalciferol 1.25 MG (50000 UT) capsule   glyBURIDE 2.5 MG tablet Commonly known as: DIABETA   ibuprofen 600 MG tablet Commonly known as:  ADVIL   omeprazole 20 MG tablet Commonly known as: PRILOSEC OTC       TAKE these medications    prenatal multivitamin Tabs tablet Take 1 tablet by mouth at bedtime.         Discharge home in stable condition Infant Feeding: Breast Infant Disposition:home with mother Discharge instruction: per After Visit Summary and Postpartum booklet. Activity: Advance as tolerated. Pelvic rest for 6 weeks.  Diet: routine diet Anticipated Birth Control: Unsure Postpartum Appointment:6 weeks Additional Postpartum F/U:  not applicable Future Appointments:No future appointments. Follow up Visit:      03/14/2023 Jeani Hawking, MD

## 2023-03-14 NOTE — Progress Notes (Addendum)
MOB was referred for history of anxiety.  * Referral screened out by Clinical Social Worker because none of the following criteria appear to apply:  ~ History of anxiety during this pregnancy, or of post-partum depression following prior delivery.  ~ Diagnosis of anxiety within last 3 years  Per OB notes, MOB did not indicate any signs/symptoms during pregnancy.  OR   * MOB's symptoms currently being treated with medication and/or therapy.   Please contact the Clinical Social Worker if needs arise, by MOB request, or if MOB scores greater than 9/yes to question 10 on Edinburgh Postpartum Depression Screen.   Rudolf Blizard, LCSWA Clinical Social Worker [phone] 

## 2023-03-15 ENCOUNTER — Other Ambulatory Visit (HOSPITAL_COMMUNITY): Payer: Self-pay

## 2023-03-15 LAB — SURGICAL PATHOLOGY

## 2023-03-15 MED ORDER — POLYETHYLENE GLYCOL 3350 17 G PO PACK
17.0000 g | PACK | Freq: Every day | ORAL | Status: DC
  Administered 2023-03-15: 17 g via ORAL
  Filled 2023-03-15: qty 1

## 2023-03-15 MED ORDER — IBUPROFEN 600 MG PO TABS
600.0000 mg | ORAL_TABLET | Freq: Four times a day (QID) | ORAL | 0 refills | Status: AC
  Filled 2023-03-15: qty 30, 8d supply, fill #0

## 2023-03-15 MED ORDER — ACETAMINOPHEN 325 MG PO TABS
650.0000 mg | ORAL_TABLET | ORAL | 0 refills | Status: AC | PRN
  Filled 2023-03-15: qty 30, 3d supply, fill #0

## 2023-03-15 NOTE — Progress Notes (Signed)
Postpartum Progress Note  Post Partum Day 2 s/p spontaneous vaginal delivery.  Patient reports well-controlled pain, ambulating without difficulty, voiding spontaneously, tolerating PO.  Vaginal bleeding is appropriate.   Objective: Blood pressure 112/83, pulse 71, temperature 98.1 F (36.7 C), temperature source Oral, resp. rate 18, height 5\' 4"  (1.626 m), weight 80.9 kg, SpO2 98%, unknown if currently breastfeeding.  Physical Exam:  General: alert and no distress Lochia: appropriate Uterine Fundus: firm DVT Evaluation: No evidence of DVT seen on physical exam.  Recent Labs    03/12/23 1801 03/14/23 0412  HGB 13.3 10.9*  HCT 38.0 32.3*    Assessment/Plan: Postpartum Day 2, s/p vaginal delivery. Continue routine postpartum care Lactation following Baby boy s/p circ.  He will likely go to NICU for suspected in-utero testicular torsion. Anticipate discharge home today, room in.    LOS: 3 days   Lyn Henri 03/15/2023, 7:49 AM

## 2023-03-15 NOTE — Care Management (Signed)
Patient had consult for medication assistance.  CM called pharmacy and patient has active insurance and had copay of $ 4.52  for medication. Pharmacy will bill her from [facility] and CM spoke to patient and made her aware and that she did not qualify for mediation assistance.   Gretchen Short RNC-MNN, BSN Transitions of Care [facility]

## 2023-03-16 ENCOUNTER — Other Ambulatory Visit (HOSPITAL_COMMUNITY): Payer: Self-pay

## 2023-03-16 ENCOUNTER — Ambulatory Visit (HOSPITAL_COMMUNITY): Payer: Self-pay

## 2023-03-16 MED ORDER — OXYCODONE HCL 5 MG PO TABS
5.0000 mg | ORAL_TABLET | ORAL | 0 refills | Status: DC
Start: 2023-03-16 — End: 2023-04-25
  Filled 2023-03-16: qty 8, 2d supply, fill #0

## 2023-03-16 NOTE — Lactation Note (Signed)
This note was copied from a baby's chart. Lactation Consultation Note  Patient Name: Natalie Brennan Date: 03/16/2023 Age:39 hours Reason for consult: Follow-up assessment;Early term 37-38.6wks;Maternal endocrine disorder GDM on Glyburide  LC in to visit with P4 Mom of ET infant delivered vaginally.  Baby has been exclusively breastfeeding, and weight increased 25 gms from yesterday and is at a 7.6% loss from birth weight.   Baby's output is good and bilirubin level dropping.  Baby at the breast in cradle hold with a deep latch and finishing his 20 min feeding.  Mom denies any pain with latch and describes baby swallowing, latch scores of 10 given by RNs.  Baby to transfer to NICU later today and will have surgery for suspected testicular torsion.  Mom would like to continue exclusive breastfeeding, but understands that she may have to pump and bottle feed for a short period due to infant being in OR and/or sleepy post op. DEBP set up in room, but has not used it yet.  Mom aware of [facility] in NICU room and encouraged to bring her parts and washing bins to baby's room when he is transferred.  Encouraged Mom to request [facility] assistance prn once baby transfers to NICU.  Feeding Mother's Current Feeding Choice: Breast Milk  Lactation Tools Discussed/Used Tools: Pump Breast pump type: Double-Electric Breast Pump Reason for Pumping: Support milk supply/infant to have surgical procedure 10/10 Pumping frequency: Will pump prn while infant is in surgery or after depending on baby's alertness to latch and feed well.  Interventions Interventions: Education;Pace feeding  Discharge Discharge Education: Engorgement and breast care  Consult Status Consult Status: NICU follow-up Date: 03/16/23 Follow-up type: In-patient    Judee Clara 03/16/2023, 12:20 PM

## 2023-03-17 LAB — CULTURE, BLOOD (ROUTINE X 2)
Culture: NO GROWTH
Culture: NO GROWTH
Special Requests: ADEQUATE
Special Requests: ADEQUATE

## 2023-03-18 ENCOUNTER — Ambulatory Visit (HOSPITAL_COMMUNITY): Payer: Self-pay

## 2023-03-18 ENCOUNTER — Inpatient Hospital Stay (HOSPITAL_COMMUNITY): Payer: Medicaid Other

## 2023-03-18 ENCOUNTER — Inpatient Hospital Stay (HOSPITAL_COMMUNITY)
Admission: RE | Admit: 2023-03-18 | Payer: Medicaid Other | Source: Home / Self Care | Admitting: Obstetrics and Gynecology

## 2023-03-18 NOTE — Lactation Note (Signed)
This note was copied from a baby's chart.  NICU Lactation Consultation Note  Patient Name: Natalie Brennan [id number]'C Date: 03/18/2023 Age:37 years  Reason for consult: Follow-up assessment; NICU baby; Early term 38-38.6wks; Maternal endocrine disorder; Other (Comment) (AMA) Type of Endocrine Disorder?: Diabetes (GDMA2 (glyburide))  SUBJECTIVE Visited with family of 39 days old ETI NICU female; Natalie Brennan is a P4 and experienced breastfeeding. She voiced her plan is to exclusively breastfeed baby "Natalie Brennan"; she didn't do any pumping while at the hospital because she didn't know if he was going to take a bottle and she really wanted him to get her colostrum. Her plan is to exclusively breastfeed once at home, she won't be going back to work. Parents are taking baby Natalie Brennan home today. Reviewed discharge education and the importance of consistent milk removal whether is with the baby's mouth or a breast pump in case of engorgement. She politely declined an LC OP F/U but has out contact info in case she needs to reach out. FOB present and supportive. All questions and concerns answered, family to contact [facility] services PRN.  OBJECTIVE Infant data: Mother's Current Feeding Choice: Breast Milk  O2 Device: Room Air  Infant feeding assessment Scale for Readiness: 1   Maternal data: [id number] Vaginal, Spontaneous Current breast feeding challenges:: NICU admission Risk factor for low/delayed milk supply:: infant separation, AMA, A2GDM  Pump: Personal (Medela DEBP at home)  ASSESSMENT Infant: Latch: Grasps breast easily, tongue down, lips flanged, rhythmical sucking. Audible Swallowing: Spontaneous and intermittent Type of Nipple: Everted at rest and after stimulation Comfort (Breast/Nipple): Soft / non-tender Hold (Positioning): No assistance needed to correctly position infant at breast. LATCH Score: 10  Feeding Status: Ad lib Feeding method: Breast  Maternal: No data  recorded INTERVENTIONS/PLAN Interventions: Interventions: Breast feeding basics reviewed; DEBP; Education Discharge Education: Engorgement and breast care; Outpatient recommendation  Plan: Consult Status: Complete   Natalie Brennan S Natalie Brennan 03/18/2023, 11:09 AM

## 2023-04-02 DIAGNOSIS — Z3A38 38 weeks gestation of pregnancy: Secondary | ICD-10-CM

## 2023-04-02 DIAGNOSIS — Z3689 Encounter for other specified antenatal screening: Secondary | ICD-10-CM

## 2023-04-07 ENCOUNTER — Telehealth (HOSPITAL_COMMUNITY): Payer: Self-pay | Admitting: *Deleted

## 2023-04-07 NOTE — Telephone Encounter (Signed)
04/07/2023  Name: Natalie Brennan MRN: [id number] DOB: [birth year]  Reason for Call:  Transition of Care Hospital Discharge Call  Contact Status: Patient Contact Status: Complete  Language assistant needed: Interpreter Mode: Interpreter Not Needed        Follow-Up Questions: Do You Have Any Concerns About Your Health As You Heal From Delivery?: No Do You Have Any Concerns About Your Infants Health?: No  Edinburgh Postnatal Depression Scale:  In the Past 7 Days:    PHQ2-9 Depression Scale:     Discharge Follow-up: Edinburgh score requires follow up?:  (declines screening "I feel great") Patient was advised of the following resources:: Support Group, Breastfeeding Support Group  Post-discharge interventions: Reviewed Newborn Safe Sleep Practices  Salena Saner, RN 04/07/2023 13:28

## 2023-10-28 DIAGNOSIS — Z13220 Encounter for screening for lipoid disorders: Secondary | ICD-10-CM | POA: Diagnosis not present

## 2023-10-28 DIAGNOSIS — Z1321 Encounter for screening for nutritional disorder: Secondary | ICD-10-CM | POA: Diagnosis not present

## 2023-10-28 DIAGNOSIS — Z0000 Encounter for general adult medical examination without abnormal findings: Secondary | ICD-10-CM | POA: Diagnosis not present

## 2023-10-28 DIAGNOSIS — Z131 Encounter for screening for diabetes mellitus: Secondary | ICD-10-CM | POA: Diagnosis not present

## 2023-10-28 DIAGNOSIS — Z6828 Body mass index (BMI) 28.0-28.9, adult: Secondary | ICD-10-CM | POA: Diagnosis not present

## 2023-10-28 DIAGNOSIS — F32A Depression, unspecified: Secondary | ICD-10-CM | POA: Diagnosis not present

## 2023-10-28 DIAGNOSIS — Z130 Encounter for screening for diseases of the blood and blood-forming organs and certain disorders involving the immune mechanism: Secondary | ICD-10-CM | POA: Diagnosis not present

## 2023-10-28 DIAGNOSIS — F419 Anxiety disorder, unspecified: Secondary | ICD-10-CM | POA: Diagnosis not present

## 2023-10-28 DIAGNOSIS — Z1329 Encounter for screening for other suspected endocrine disorder: Secondary | ICD-10-CM | POA: Diagnosis not present

## 2023-10-28 DIAGNOSIS — Z13228 Encounter for screening for other metabolic disorders: Secondary | ICD-10-CM | POA: Diagnosis not present

## 2023-11-24 DIAGNOSIS — F32A Depression, unspecified: Secondary | ICD-10-CM | POA: Diagnosis not present

## 2023-11-24 DIAGNOSIS — F419 Anxiety disorder, unspecified: Secondary | ICD-10-CM | POA: Diagnosis not present
